# Patient Record
Sex: Female | Born: 1985 | Race: White | Hispanic: No | Marital: Married | Smoking: Never smoker
Health system: Southern US, Community
[De-identification: ages and names within clinical notes are randomized; demographics above are authoritative.]

## PROBLEM LIST (undated history)

## (undated) DIAGNOSIS — B019 Varicella without complication: Secondary | ICD-10-CM

## (undated) DIAGNOSIS — Z975 Presence of (intrauterine) contraceptive device: Secondary | ICD-10-CM

## (undated) DIAGNOSIS — K219 Gastro-esophageal reflux disease without esophagitis: Secondary | ICD-10-CM

## (undated) DIAGNOSIS — E282 Polycystic ovarian syndrome: Secondary | ICD-10-CM

## (undated) DIAGNOSIS — F418 Other specified anxiety disorders: Secondary | ICD-10-CM

## (undated) HISTORY — DX: Gastro-esophageal reflux disease without esophagitis: K21.9

## (undated) HISTORY — DX: Varicella without complication: B01.9

## (undated) HISTORY — DX: Other specified anxiety disorders: F41.8

## (undated) HISTORY — DX: Presence of (intrauterine) contraceptive device: Z97.5

## (undated) HISTORY — DX: Polycystic ovarian syndrome: E28.2

---
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
## 2009-05-23 ENCOUNTER — Emergency Department (HOSPITAL_COMMUNITY): Admission: EM | Admit: 2009-05-23 | Discharge: 2009-05-23 | Payer: Self-pay | Admitting: Family Medicine

## 2014-04-04 HISTORY — PX: MOUTH SURGERY: SHX715

## 2014-11-04 ENCOUNTER — Other Ambulatory Visit: Payer: Self-pay | Admitting: Physician Assistant

## 2014-11-04 DIAGNOSIS — C220 Liver cell carcinoma: Secondary | ICD-10-CM

## 2014-11-20 ENCOUNTER — Encounter (HOSPITAL_COMMUNITY): Payer: Self-pay | Admitting: Emergency Medicine

## 2014-11-20 ENCOUNTER — Emergency Department (HOSPITAL_COMMUNITY)
Admission: EM | Admit: 2014-11-20 | Discharge: 2014-11-20 | Disposition: A | Discharge status: Home / Self Care | Payer: BC Managed Care – PPO | Attending: Emergency Medicine | Admitting: Emergency Medicine

## 2014-11-20 ENCOUNTER — Emergency Department (HOSPITAL_COMMUNITY): Payer: BC Managed Care – PPO

## 2014-11-20 DIAGNOSIS — R55 Syncope and collapse: Secondary | ICD-10-CM | POA: Insufficient documentation

## 2014-11-20 DIAGNOSIS — R0600 Dyspnea, unspecified: Secondary | ICD-10-CM | POA: Diagnosis not present

## 2014-11-20 DIAGNOSIS — Z79899 Other long term (current) drug therapy: Secondary | ICD-10-CM | POA: Insufficient documentation

## 2014-11-20 DIAGNOSIS — Z8659 Personal history of other mental and behavioral disorders: Secondary | ICD-10-CM | POA: Diagnosis not present

## 2014-11-20 DIAGNOSIS — R42 Dizziness and giddiness: Secondary | ICD-10-CM

## 2014-11-20 DIAGNOSIS — R0602 Shortness of breath: Secondary | ICD-10-CM | POA: Diagnosis present

## 2014-11-20 LAB — I-STAT CHEM 8, ED
BUN: 16 mg/dL (ref 6–20)
CALCIUM ION: 1.18 mmol/L (ref 1.12–1.23)
CREATININE: 0.8 mg/dL (ref 0.44–1.00)
Chloride: 102 mmol/L (ref 101–111)
Glucose, Bld: 103 mg/dL — ABNORMAL HIGH (ref 65–99)
HEMATOCRIT: 40 % (ref 36.0–46.0)
HEMOGLOBIN: 13.6 g/dL (ref 12.0–15.0)
Potassium: 4.2 mmol/L (ref 3.5–5.1)
SODIUM: 138 mmol/L (ref 135–145)
TCO2: 23 mmol/L (ref 0–100)

## 2014-11-20 LAB — BASIC METABOLIC PANEL
ANION GAP: 9 (ref 5–15)
BUN: 14 mg/dL (ref 6–20)
CALCIUM: 9.3 mg/dL (ref 8.9–10.3)
CO2: 25 mmol/L (ref 22–32)
Chloride: 105 mmol/L (ref 101–111)
Creatinine, Ser: 0.78 mg/dL (ref 0.44–1.00)
Glucose, Bld: 108 mg/dL — ABNORMAL HIGH (ref 65–99)
Potassium: 3.9 mmol/L (ref 3.5–5.1)
SODIUM: 139 mmol/L (ref 135–145)

## 2014-11-20 LAB — CBC
HCT: 39.6 % (ref 36.0–46.0)
HEMOGLOBIN: 13.3 g/dL (ref 12.0–15.0)
MCH: 28.5 pg (ref 26.0–34.0)
MCHC: 33.6 g/dL (ref 30.0–36.0)
MCV: 84.8 fL (ref 78.0–100.0)
PLATELETS: 303 10*3/uL (ref 150–400)
RBC: 4.67 MIL/uL (ref 3.87–5.11)
RDW: 13.4 % (ref 11.5–15.5)
WBC: 8.7 10*3/uL (ref 4.0–10.5)

## 2014-11-20 LAB — D-DIMER, QUANTITATIVE: D-Dimer, Quant: 0.27 ug/mL-FEU (ref 0.00–0.48)

## 2014-11-20 LAB — I-STAT TROPONIN, ED: TROPONIN I, POC: 0 ng/mL (ref 0.00–0.08)

## 2014-11-20 NOTE — ED Notes (Signed)
Phlebotomy at the bedside  

## 2014-11-20 NOTE — ED Notes (Signed)
Dr. Mariane Masters at the bedside.

## 2014-11-20 NOTE — ED Notes (Signed)
Called main lab for d-dimer to add on.

## 2014-11-20 NOTE — ED Notes (Signed)
Orthostatics given to Dr. Mariane Masters, no new orders.

## 2014-11-20 NOTE — ED Provider Notes (Signed)
CSN: [id number]     Arrival date & time 11/20/14  0004 History  This chart was scribed for Varney Biles, MD by Julien Nordmann, ED Scribe. This patient was seen in room D33C/D33C and the patient's care was started at 2:11 AM.    Chief Complaint  Patient presents with  . Shortness of Breath      The history is provided by the patient. No language interpreter was used.   HPI Comments: Bridget Rich is a 29 y.o. female who presents to the Emergency Department complaining of sudden onset shortness of breath onset this evening. She states she was feeling shortness of breath while sitting on the couch and got up to use the restroom when she reports she began to feel dizzy and intermittent lightheadedness. She states she felt light headed while sitting and standing. Pt reports having some chest pain shortly after but notes she feels as if because she began to feel worried. Pt is currently on microgestin for birth control. Pt denies heart issues and lung problems, hx of blood clots in legs/lungs, long distance travel in past six weeks, recent surgery in past 6 weeks, recent episodes where she was bed ridden, chances of pregnancy, nausea and diaphoresis.  Pt is on an inosital that she was put on by her nutritionist.  Past Medical History  Diagnosis Date  . Anxiety    History reviewed. No pertinent past surgical history. No family history on file. Social History  Substance Use Topics  . Smoking status: Never Smoker   . Smokeless tobacco: None  . Alcohol Use: No   OB History    No data available     Review of Systems  A complete 10 system review of systems was obtained and all systems are negative except as noted in the HPI and PMH.    Allergies  Augmentin and Ceclor  Home Medications   Prior to Admission medications   Medication Sig Start Date End Date Taking? Authorizing Provider  norethindrone-ethinyl estradiol-iron (MICROGESTIN FE 1.5/30) 1.5-30 MG-MCG tablet Take 1 tablet by  mouth daily.   Yes Historical Provider, MD   Triage vitals: BP 126/79 mmHg  Pulse 79  Temp(Src) 98 F (36.7 C) (Oral)  Resp 24  SpO2 99%  LMP 11/18/2014 Physical Exam  Constitutional: She is oriented to person, place, and time. She appears well-developed and well-nourished. No distress.  HENT:  Head: Normocephalic and atraumatic.  Mouth/Throat: Oropharynx is clear and moist.  Eyes: EOM are normal.  Neck: Normal range of motion. No JVD present.  Cardiovascular: Normal rate, regular rhythm and normal heart sounds.   Pulmonary/Chest: Effort normal and breath sounds normal.  Lungs clear to auscultation  Abdominal: Soft. She exhibits no distension. There is no tenderness.  No palpable mass  Musculoskeletal: Normal range of motion.  Neurological: She is alert and oriented to person, place, and time.  2+ equal radial pulse bilaterally  Skin: Skin is warm and dry.  Psychiatric: She has a normal mood and affect. Judgment normal.  Nursing note and vitals reviewed.   ED Course  Procedures   DIAGNOSTIC STUDIES: Oxygen Saturation is 99% on RA, normal by my interpretation.  COORDINATION OF CARE:  2:21 AM Discussed treatment plan with pt at bedside and pt agreed to plan.  Labs Review Labs Reviewed  BASIC METABOLIC PANEL - Abnormal; Notable for the following:    Glucose, Bld 108 (*)    All other components within normal limits  I-STAT CHEM 8, ED - Abnormal;  Notable for the following:    Glucose, Bld 103 (*)    All other components within normal limits  CBC  D-DIMER, QUANTITATIVE (NOT AT [facility])  Randolm Idol, ED    Imaging Review Dg Chest 2 View  11/20/2014   CLINICAL DATA:  Shortness of breath and dizziness  EXAM: CHEST  2 VIEW  COMPARISON:  None currently available  FINDINGS: Normal heart size and mediastinal contours. No acute infiltrate or edema. No effusion or pneumothorax. Thoracic dextrocurvature without acute osseous finding.  IMPRESSION: 1. No active cardiopulmonary  disease. 2. Thoracic scoliosis   Electronically Signed   By: Monte Fantasia M.D.   On: 11/20/2014 01:25   I have personally reviewed and evaluated these images and lab results as part of my medical decision-making.   EKG Interpretation   Date/Time:  Thursday November 20 2014 00:09:19 EDT Ventricular Rate:  91 PR Interval:  178 QRS Duration: 72 QT Interval:  378 QTC Calculation: 464 R Axis:   47 Text Interpretation:  Normal sinus rhythm Normal ECG No acute changes no  old tracing Confirmed by Kathrynn Humble, MD, Thelma Comp [phone]) on 11/20/2014 2:22:09  AM      MDM   Final diagnoses:  Dizziness  Dyspnea  Near syncope    I personally performed the services described in this documentation, which was scribed in my presence. The recorded information has been reviewed and is accurate.  Pt comes in with cc of shortness of breath and dizziness - near syncope.  Near syncope x 1, dizziness is intermittent, she had 1 episode of dyspnea.  Pt is taking estrogen supplement, has hx of PCOS. WELLS score is 0 - and dimer ordered - and is neg. Will dc. Orthostatics neg as well. Advised pcp f/u.    Varney Biles, MD 11/20/14 [phone]

## 2014-11-20 NOTE — Discharge Instructions (Signed)
We saw you in the ER for the dizziness and the shortness of breath. All the results in the ER are normal, labs and imaging. We even screened you for blood clot -and the results for that are normal as well. We are not sure what is causing your symptoms. The workup in the ER is not complete, and is limited to screening for life threatening and emergent conditions only, so please see a primary care doctor for further evaluation.   Dizziness Dizziness is a common problem. It is a feeling of unsteadiness or light-headedness. You may feel like you are about to faint. Dizziness can lead to injury if you stumble or fall. A person of any age group can suffer from dizziness, but dizziness is more common in older adults. CAUSES  Dizziness can be caused by many different things, including:  Middle ear problems.  Standing for too long.  Infections.  An allergic reaction.  Aging.  An emotional response to something, such as the sight of blood.  Side effects of medicines.  Tiredness.  Problems with circulation or blood pressure.  Excessive use of alcohol or medicines, or illegal drug use.  Breathing too fast (hyperventilation).  An irregular heart rhythm (arrhythmia).  A low red blood cell count (anemia).  Pregnancy.  Vomiting, diarrhea, fever, or other illnesses that cause body fluid loss (dehydration).  Diseases or conditions such as Parkinson's disease, high blood pressure (hypertension), diabetes, and thyroid problems.  Exposure to extreme heat. DIAGNOSIS  Your health care provider will ask about your symptoms, perform a physical exam, and perform an electrocardiogram (ECG) to record the electrical activity of your heart. Your health care provider may also perform other heart or blood tests to determine the cause of your dizziness. These may include:  Transthoracic echocardiogram (TTE). During echocardiography, sound waves are used to evaluate how blood flows through your  heart.  Transesophageal echocardiogram (TEE).  Cardiac monitoring. This allows your health care provider to monitor your heart rate and rhythm in real time.  Holter monitor. This is a portable device that records your heartbeat and can help diagnose heart arrhythmias. It allows your health care provider to track your heart activity for several days if needed.  Stress tests by exercise or by giving medicine that makes the heart beat faster. TREATMENT  Treatment of dizziness depends on the cause of your symptoms and can vary greatly. HOME CARE INSTRUCTIONS   Drink enough fluids to keep your urine clear or pale yellow. This is especially important in very hot weather. In older adults, it is also important in cold weather.  Take your medicine exactly as directed if your dizziness is caused by medicines. When taking blood pressure medicines, it is especially important to get up slowly.  Rise slowly from chairs and steady yourself until you feel okay.  In the morning, first sit up on the side of the bed. When you feel okay, stand slowly while holding onto something until you know your balance is fine.  Move your legs often if you need to stand in one place for a long time. Tighten and relax your muscles in your legs while standing.  Have someone stay with you for 1-2 days if dizziness continues to be a problem. Do this until you feel you are well enough to stay alone. Have the person call your health care provider if he or she notices changes in you that are concerning.  Do not drive or use heavy machinery if you feel dizzy.  Do not drink alcohol. SEEK IMMEDIATE MEDICAL CARE IF:   Your dizziness or light-headedness gets worse.  You feel nauseous or vomit.  You have problems talking, walking, or using your arms, hands, or legs.  You feel weak.  You are not thinking clearly or you have trouble forming sentences. It may take a friend or family member to notice this.  You have chest  pain, abdominal pain, shortness of breath, or sweating.  Your vision changes.  You notice any bleeding.  You have side effects from medicine that seems to be getting worse rather than better. MAKE SURE YOU:   Understand these instructions.  Will watch your condition.  Will get help right away if you are not doing well or get worse. Document Released: 09/14/2000 Document Revised: 03/26/2013 Document Reviewed: 10/08/2010 [facility] Patient Information 2015 [place], [place]. This information is not intended to replace advice given to you by your health care provider. Make sure you discuss any questions you have with your health care provider. Near-Syncope Near-syncope (commonly known as near fainting) is sudden weakness, dizziness, or feeling like you might pass out. During an episode of near-syncope, you may also develop pale skin, have tunnel vision, or feel sick to your stomach (nauseous). Near-syncope may occur when getting up after sitting or while standing for a long time. It is caused by a sudden decrease in blood flow to the brain. This decrease can result from various causes or triggers, most of which are not serious. However, because near-syncope can sometimes be a sign of something serious, a medical evaluation is required. The specific cause is often not determined. HOME CARE INSTRUCTIONS  Monitor your condition for any changes. The following actions may help to alleviate any discomfort you are experiencing:  Have someone stay with you until you feel stable.  Lie down right away and prop your feet up if you start feeling like you might faint. Breathe deeply and steadily. Wait until all the symptoms have passed. Most of these episodes last only a few minutes. You may feel tired for several hours.   Drink enough fluids to keep your urine clear or pale yellow.   If you are taking blood pressure or heart medicine, get up slowly when seated or lying down. Take several minutes to sit and  then stand. This can reduce dizziness.  Follow up with your health care provider as directed. SEEK IMMEDIATE MEDICAL CARE IF:   You have a severe headache.   You have unusual pain in the chest, abdomen, or back.   You are bleeding from the mouth or rectum, or you have black or tarry stool.   You have an irregular or very fast heartbeat.   You have repeated fainting or have seizure-like jerking during an episode.   You faint when sitting or lying down.   You have confusion.   You have difficulty walking.   You have severe weakness.   You have vision problems.  MAKE SURE YOU:   Understand these instructions.  Will watch your condition.  Will get help right away if you are not doing well or get worse. Document Released: 03/21/2005 Document Revised: 03/26/2013 Document Reviewed: 08/24/2012 [facility] Patient Information 2015 [place], [place]. This information is not intended to replace advice given to you by your health care provider. Make sure you discuss any questions you have with your health care provider.

## 2014-11-20 NOTE — ED Notes (Signed)
C/o feeling sob and lightheaded x 45 min.  Denies chest pain.  NAD at this time.

## 2014-11-24 ENCOUNTER — Ambulatory Visit
Admission: RE | Admit: 2014-11-24 | Discharge: 2014-11-24 | Disposition: A | Discharge status: Home / Self Care | Payer: BC Managed Care – PPO | Source: Ambulatory Visit | Attending: Physician Assistant | Admitting: Physician Assistant

## 2014-11-24 ENCOUNTER — Other Ambulatory Visit: Payer: Self-pay | Admitting: Physician Assistant

## 2014-11-24 DIAGNOSIS — R1013 Epigastric pain: Secondary | ICD-10-CM

## 2014-11-24 DIAGNOSIS — C220 Liver cell carcinoma: Secondary | ICD-10-CM

## 2016-01-18 LAB — HM PAP SMEAR: HM Pap smear: NEGATIVE

## 2016-01-21 ENCOUNTER — Ambulatory Visit (INDEPENDENT_AMBULATORY_CARE_PROVIDER_SITE_OTHER): Payer: BC Managed Care – PPO | Admitting: Family Medicine

## 2016-01-21 ENCOUNTER — Encounter: Payer: Self-pay | Admitting: Family Medicine

## 2016-01-21 DIAGNOSIS — E282 Polycystic ovarian syndrome: Secondary | ICD-10-CM | POA: Diagnosis not present

## 2016-01-21 NOTE — Progress Notes (Signed)
Medical Nutrition Therapy:  Appt start time: 1430 end time:  [id number].  Assessment:  Primary concerns today: Weight management and best diet to prevent diabetes.  Arlicia has seen another RD, who helped her tremendously with her PCOS.  She would like more guidance and structure with respect to her food choices than provided by her RD, however.  Adryana said she is fine with her body image, but that she knows she does not eat healthfully, and would like to change this.  She is aware of her risk for diabetes, and seems very motivated to prevent this.  She tried a course of metformin, but did not tolerate it well.  Last A1C was 5.4.    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  Her lunch time M-F is consistent.  She shares a home with her boyfriend, with whom she eats dinner, which is usually takeout or restaurant meals.  Kamarah does not like to shop or to  cook.    Learning Readiness: Change in progress  Usual eating pattern includes 3 meals and 0-1 snack per day. Frequent foods and beverages include water. Half-sweet tea; tuna, apple, and string cheese for lunch; pizza/Mexican/store chx pot pie/spaghetti/steak.  Avoided foods include olives, hotdogs.   Djuana said she grew up eating a lot of fast food and processed foods.   Usual physical activity includes inconsistent kick boxing, ~30 min walking the dog most days of the week.  24-hr recall: (Up at 6:40 AM) B (7:30 AM)-   [place] spicy fried chx&honey biscuit, potato cake, 20 oz half-swt tea Snk ( AM)-   Water L (12 PM)-  3 oz tuna, 1 str cheese, apple, water Snk ( PM)-  water D (6:30 PM)-  1/2 c lentil noodles, 3 c meat sauce, water Snk ( PM)-  --- Typical day? Yes.  On weekends lunch varies, usually restaurant or takeout or skipped following a brunch.    Progress Towards Goal(s):  In progress.   Nutritional Diagnosis:  [place]-3.3 Overweight/obesity As related to energy imbalance.  As evidenced by BMI >43.    Intervention:   Nutrition education.  Handouts given during visit include:  AVS  Demonstrated degree of understanding via:  Teach Back  Barriers to learning/adherence to lifestyle change: Longstanding habits; disinterest in food preparation and reliance on restaurant foods.    Monitoring/Evaluation:  Dietary intake, exercise, and body weight in 6 week(s).

## 2016-01-21 NOTE — Patient Instructions (Addendum)
-   TASTE PREFERENCES ARE LEARNED.  This means that it will get easier to choose foods you know are good for you if you are exposed to them enough.  Preference tends to follow practice.    - Eat at least 3 REAL meals (and 1-2 snacks per day).  Aim for no more than 5 hours between eating.  Eat breakfast within one hour of getting up.    - A REAL meal includes a source of protein, some starch, and veg's and/or fruit.    - OR: Would you serve this to a guest in your home, and call it a meal.   - If your current lunch doesn't allow you to get to dinner without feeling very hungry, include a snack in the afternoon, such as fruit, yogurt, <1/4 c nuts/seeds.     - Track the # of times your dinner conforms to a real meal as defined above.    - Advance planning will be key to making good food choices.    - Make a list of 7-10 dinner meals that taste good, are relatively quick and easy to prepare, and that meet your nutritional needs.  Use this as a basis for shopping, so you can make one of these meals any time.  Bring your list to your follow-up appointment for review.    - With your PCOS, you want to eat in a low-glycemic way.  Be careful about fruits like bananas, tropical fruits like pineapple, mango, papaya, grapes, oranges, watermelon.  Also limit refined carb's including bread and bakery products.  When you choose a carb food, go for ones with the most fiber.  Note: Milk and yogurt have carb's, but these will not spike your blood sugar the way starchy foods/sugars will.    - Generally, whole, real foods are better for blood sugars (vs highly processed).    - Qs? Email [email or web address].

## 2016-03-03 ENCOUNTER — Ambulatory Visit (INDEPENDENT_AMBULATORY_CARE_PROVIDER_SITE_OTHER): Payer: BC Managed Care – PPO | Admitting: Family Medicine

## 2016-03-03 ENCOUNTER — Encounter: Payer: Self-pay | Admitting: Family Medicine

## 2016-03-03 DIAGNOSIS — E282 Polycystic ovarian syndrome: Secondary | ICD-10-CM | POA: Diagnosis not present

## 2016-03-03 NOTE — Patient Instructions (Addendum)
-   Make a list of meal alternatives for times when you are in a hurry.  Include foods from home you tend to keep on hand as well as grocery store and restaurant options.    - Use this list as a basis for shopping, as you willl your dinner list.  Bring to follow-up appt for review.   - Have a conversation with Bridget Rich about how he can best support you in your efforts to eat more healthfully. - Make a list of 7-10 dinner meals that taste good, are relatively quick and easy to prepare, and that meet your nutritional needs.  Use this as a basis for shopping, so you can make one of these meals any time.  Bring your list to your follow-up appointment for review.    Goals:  1. Physical activity goal:  At least 45 minutes 3 X wk.  Record # of minutes on your calendar.  Include dog walking.   2. Eat at least 3 REAL meals (and 1-2 snacks per day).  Aim for no more than 5 hours between eating.  Eat breakfast within one hour of getting up.   - Track the # of times your dinner conforms to a real meal as defined above.   3. If you make a food or exercise choice that is not in your best long-term interest, analyze what led that choice, and what you can put in place to have a different outcome next time.    Recommended reading:  Elzie Rings Rubin's The Four Tendencies.

## 2016-03-03 NOTE — Progress Notes (Signed)
Medical Nutrition Therapy:  Appt start time: 1430 end time:  [id number]. Therapist Madalyn Rob, LPCA  Assessment:  Primary concerns today: Weight management and best diet to prevent diabetes.  Bridget Rich said she has been inconsistent in her eating behaviors.  This week, she did a lot of advance preparation for meals, which has made everything easier for her, however, and she feels like this may be a good start that she can maintain going forward.    We talked about what led to yesterday's dinner of pizza, which was mainly time crunch, and looked at options she can see in retrospect that would have been better choices.  There were surprisingly many, actually.  Also discussed talking to her boyfriend about his supporting her efforts at better eating by not bringing home pizza, Coke, and cinnamon twists for dinner.    Tylor forgot to make a list of meals, but said she will do so before her next appt.    Aven is walking her dog at least 5 X wk, but would like to incorporate additional exercise.  We established a specific goal for this, and she wants to continue to work on the 3 real meals a day goal.    24-hr recall:  (Up at 6 AM; water & 12 oz 2% [employer] milk) B (7:30 AM)-  1 fried chx biscuit, 1 tbsp honey, 1 svng hash browns, water Snk ( AM)-  water L (11 PM)-  4 oz chx, mixed veg's, water Snk (1:30 PM)-1 Kind Breakfast Bar, water   D (6:30 PM)-  4 slc pepperoni pizza, 4 cinnamon twists, 16 oz Coke Snk ( PM)-  water Typical day? Yes.  Although there are other days when all meals are made at home.    Progress Towards Goal(s):  In progress.   Nutritional Diagnosis:  [place]-3.3 Overweight/obesity As related to energy imbalance.  As evidenced by BMI >43.    Intervention:  Nutrition education.  Handouts given during visit include:  AVS  Demonstrated degree of understanding via:  Teach Back  Barriers to learning/adherence to lifestyle change: Longstanding habits; disinterest in food preparation  and reliance on restaurant foods.    Monitoring/Evaluation:  Dietary intake, exercise, and body weight in 8 week(s).  No appts available sooner.

## 2016-05-03 ENCOUNTER — Ambulatory Visit: Payer: BC Managed Care – PPO | Admitting: Family Medicine

## 2016-05-26 ENCOUNTER — Ambulatory Visit: Payer: BC Managed Care – PPO | Admitting: Family Medicine

## 2016-12-03 HISTORY — PX: BARIATRIC SURGERY: SHX1103

## 2017-01-25 LAB — HEPATIC FUNCTION PANEL
ALT: 24 (ref 7–35)
AST: 23 (ref 13–35)
Bilirubin, Total: 0.4

## 2017-01-25 LAB — BASIC METABOLIC PANEL
Creatinine: 0.6 (ref <=1.1)
Glucose: 94
Potassium: 4.1 (ref 3.4–5.3)
Sodium: 136 — AB (ref 137–147)

## 2017-12-29 ENCOUNTER — Encounter: Payer: Self-pay | Admitting: Sports Medicine

## 2017-12-29 ENCOUNTER — Ambulatory Visit: Payer: Commercial Managed Care - PPO | Admitting: Sports Medicine

## 2017-12-29 DIAGNOSIS — M9903 Segmental and somatic dysfunction of lumbar region: Secondary | ICD-10-CM

## 2017-12-29 DIAGNOSIS — M9902 Segmental and somatic dysfunction of thoracic region: Secondary | ICD-10-CM | POA: Diagnosis not present

## 2017-12-29 DIAGNOSIS — M9908 Segmental and somatic dysfunction of rib cage: Secondary | ICD-10-CM

## 2017-12-29 DIAGNOSIS — M9901 Segmental and somatic dysfunction of cervical region: Secondary | ICD-10-CM | POA: Diagnosis not present

## 2017-12-29 DIAGNOSIS — M545 Low back pain, unspecified: Secondary | ICD-10-CM

## 2017-12-29 DIAGNOSIS — M9905 Segmental and somatic dysfunction of pelvic region: Secondary | ICD-10-CM

## 2017-12-29 NOTE — Progress Notes (Signed)

## 2017-12-29 NOTE — Patient Instructions (Addendum)
Please perform the exercise program that we have prepared for you and gone over in detail on a daily basis.  In addition to the handout you were provided you can access your program through: [email or web address]   Your unique program code is: Rodrigo Ran    Also check out [employer]" which is a program developed by Dr. Minerva Ends.   There are links to a couple of his YouTube Videos below and I would like to see performing one of his videos 5-6 days per week.    A good intro video is: "Independence from Pain 7-minute Video" - [email or web address]   Exercises that focus more on the neck are as below: Dr. Archie Balboa with [place] teaching neck and shoulder details Part 1 - https://[email or web address] Part 2 Dr. Archie Balboa with [facility] quick routine to practice daily - [email or web address]  Do not try to attempt the entire video when first beginning.    Try breaking of each exercise that he goes into shorter segments.  Otherwise if they perform an exercise for 45 seconds, start with 15 seconds and rest and then resume when they begin the new activity.  If you work your way up to being able to do these videos without having to stop, I expect you will see significant improvements in your pain.  If you enjoy his videos and would like to find out more you can look on his website: [email or web address]  He has a workout streaming option as well as a DVD set available for purchase.  Amazon has the best price for his DVDs.

## 2017-12-29 NOTE — Progress Notes (Signed)
PROCEDURE NOTE : OSTEOPATHIC MANIPULATION The decision today to treat with Osteopathic Manipulative Therapy (OMT) was based on physical exam findings. Verbal consent was obtained following a discussion with the patient regarding the of risks, benefits and potential side effects, including an acute pain flare,post manipulation soreness and need for repeat treatments. Additionally, we specifically discussed the minimal risk of  injury to neurovascular structures associated with Cervical manipulation.   Contraindications to OMT: NONE  Manipulation was performed as below: Regions Treated OMT Techniques Used  Cervical spine Thoracic spine Ribs Lumbar spine Pelvis HVLA muscle energy myofascial release articulatory facilitated positional release   The patient tolerated the treatment well and reported Improved symptoms following treatment today. Patient was given medications, exercises, stretches and lifestyle modifications per AVS and verbally.   OSTEOPATHIC/STRUCTURAL EXAM:   OA - rotated right C5 ERS left (Extended, Rotated & Sidebent) T2 -4 Neutral, Rotated LEFT, Sidebent RIGHT T8 -10 Neutral, Rotated RIGHT, Sidebent LEFT Rib 6 Right  Posterior L4 FRS right (Flexed, Rotated & Sidebent) Left psoas spasm Left anterior innonimate

## 2017-12-29 NOTE — Progress Notes (Signed)
Juanda Bond. Keyandre Pileggi, [place] at [place] - 32 y.o. female MRN [id number]  Date of birth: 1985/11/03  Visit Date: 12/29/2017  PCP: Marylynn Pearson, MD   Referred by: Marylynn Pearson, MD   Scribe(s) for today's visit: Josepha Pigg, CMA  SUBJECTIVE:  Bridget Rich is here for Initial Assessment (back pain)   HPI: Her back pain symptoms INITIALLY: Began several years ago and MOI is known. She denies past injury to the neck or back. She is R hand dominant.  Described as mild stiffness, radiating to both arms, L>R. She reports occasional n/t in her fingers, L hand.  Worsened with squatting, extending legs in seating position.  Additional associated symptoms include: She has noticed popping in the L hip when sitting with legs extended and then pulling them towards her. She reports having long term issues with tenseness in the upper back, L>R. She had bariatric surgery about 1 year ago, she has lost 90 lbs. She reports popping in the knee. She denies swelling in legs, ankles, feet. 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 She denies weakness in L arm and leg.     At this time symptoms are worsening compared to onset. She has been seen by chiro in the past, had XR but no adjustment was done. She has taken Tylenol prn for pain with some relief, mostly for HA.   REVIEW OF SYSTEMS: Denies night time disturbances. Denies fevers, chills, or night sweats. Denies unexplained weight loss (imtentional weight loss s/p bariatric surgery). Denies personal history of cancer. Denies changes in bowel or bladder habits. Denies recent unreported falls. Denies new or worsening dyspnea or wheezing. Denies headaches or dizziness.  Reports numbness, tingling fingers   Denies dizziness or presyncopal episodes Denies lower extremity edema    HISTORY:   Prior history reviewed and updated per electronic medical record.  Social History   Occupational History  . Not on file  Tobacco Use  . Smoking status: Never Smoker  . Smokeless tobacco: Never Used  Substance and Sexual Activity  . Alcohol use: No  . Drug use: No  . Sexual activity: Not on file   Social History   Social History Narrative  . Not on file    Past Medical History:  Diagnosis Date  . Anxiety   . PCOS (polycystic ovarian syndrome)    Past Surgical History:  Procedure Laterality Date  . BARIATRIC SURGERY  12/2016  . MOUTH SURGERY     family history includes Anxiety disorder in her sister and sister; Bipolar disorder in her cousin; COPD in her paternal grandfather; Depression in her father, maternal grandfather, and maternal grandmother; Diabetes in her father.  DATA OBTAINED & REVIEWED:  No results for input(s): HGBA1C, LABURIC, CREATINE in the last 8760 hours. No problems updated.   OBJECTIVE:  VS:  HT:5' 7.5" (171.5 cm)   WT:218 lb 12.8 oz (99.2 kg)  BMI:33.74    BP:110/78  HR:60bpm  TEMP: ( )  RESP:97 %   PHYSICAL EXAM: CONSTITUTIONAL: Well-developed, Well-nourished and In no acute distress PSYCHIATRIC: Alert & appropriately interactive. and Not depressed or anxious appearing. RESPIRATORY: No increased work of breathing and Trachea Midline EYES: Pupils are equal., EOM intact without nystagmus. and No scleral icterus.  VASCULAR EXAM: Warm and well perfused NEURO: unremarkable Normal associated myotomal distribution strength to manual muscle  testing Normal sensation to light touch Normal and symmetric associated DTRs  MSK Exam: BACK Exam: Normal alignment & Contours Skin: No overlying erythema/ecchymosis  MOTOR TESTING: Intact in all LE myotomes and Able to heel and toe walk without difficutly        RIGHT    LEFT Straight leg raise-------------------------: normal, no pain                         normal, no pain Braggard Stretch  Test------------------: normal, no pain                         normal, no pain Slump Sign--------------------------------: normal, no pain                         normal, no pain  REFLEXES Right Left  DTR - L3/4 -Patellar 2+ 2+  DTR - L5/S1 - Achilles 2+ 2+    ASSESSMENT  No diagnosis found.  PLAN:  Pertinent additional documentation may be included in corresponding procedure notes, imaging studies, problem based documentation and patient instructions.  Procedures:  . Osteopathic manipulation was performed today based on physical exam findings.  Please see procedure note for further information including Osteopathic Exam findings . Discussed the foundation of treatment for this condition is physical therapy and/or daily (5-6 days/week) therapeutic exercises, focusing on core strengthening, coordination, neuromuscular control/reeducation.  Therapeutic exercises prescribed per procedure note.  Medications:  No orders of the defined types were placed in this encounter.  Discussion/Instructions: No problem-specific Assessment & Plan notes found for this encounter.  . Functional low back pain.  Should respond well to conservative measures. . Links to [employer] provided today per Patient Instructions.  These exercises were developed by Minerva Ends, DC with a strong emphasis on core neuromuscular reducation and postural realignment through body-weight exercises. . Discussed red flag symptoms that warrant earlier emergent evaluation and patient voices understanding. . Activity modifications and the importance of avoiding exacerbating activities (limiting pain to no more than a 4 / 10 during or following activity) recommended and discussed.  Follow-up:  . Return in about 3 weeks (around 01/19/2018).  . If any lack of improvement consider: further diagnostic evaluation with X-rays and referral to Physical Therapy . At follow up will plan to consider: repeat osteopathic  manipulation     CMA/ATC served as scribe during this visit. History, Physical, and Plan performed by medical provider. Documentation and orders reviewed and attested to.      Gerda Diss, [facility] Physician

## 2018-01-22 ENCOUNTER — Encounter: Payer: Self-pay | Admitting: Sports Medicine

## 2018-01-22 ENCOUNTER — Ambulatory Visit: Payer: Commercial Managed Care - PPO | Admitting: Sports Medicine

## 2018-01-22 VITALS — BP 108/72 | HR 85 | Ht 67.5 in | Wt 218.2 lb

## 2018-01-22 DIAGNOSIS — M9904 Segmental and somatic dysfunction of sacral region: Secondary | ICD-10-CM

## 2018-01-22 DIAGNOSIS — G8929 Other chronic pain: Secondary | ICD-10-CM

## 2018-01-22 DIAGNOSIS — M545 Low back pain, unspecified: Secondary | ICD-10-CM

## 2018-01-22 DIAGNOSIS — M9906 Segmental and somatic dysfunction of lower extremity: Secondary | ICD-10-CM

## 2018-01-22 DIAGNOSIS — M9905 Segmental and somatic dysfunction of pelvic region: Secondary | ICD-10-CM

## 2018-01-22 NOTE — Progress Notes (Signed)
PROCEDURE NOTE : OSTEOPATHIC MANIPULATION The decision today to treat with Osteopathic Manipulative Therapy (OMT) was based on physical exam findings. Verbal consent was obtained following a discussion with the patient regarding the of risks, benefits and potential side effects, including an acute pain flare,post manipulation soreness and need for repeat treatments.     Contraindications to OMT: NONE  Manipulation was performed as below: Regions Treated OMT Techniques Used  Lumbar spine Pelvis Lower extremities HVLA muscle energy myofascial release articulatory soft tissue facilitated positional release   The patient tolerated the treatment well and reported Improved symptoms following treatment today. Patient was given medications, exercises, stretches and lifestyle modifications per AVS and verbally.   OSTEOPATHIC/STRUCTURAL EXAM:   L3 FRS left (Flexed, Rotated & Sidebent) Left psoas spasm Left anterior innonimate left externally rotated hip with hip flexor spasm

## 2018-01-22 NOTE — Progress Notes (Signed)
Juanda Bond. Ayahna Solazzo, [place] at [place] - 32 y.o. female MRN [id number]  Date of birth: [birth year]  Visit Date: 01/22/2018  PCP: Marylynn Pearson, MD   Referred by: Marylynn Pearson, MD   Scribe(s) for today's visit: Wendy Poet, LAT, ATC  SUBJECTIVE:  Bridget Rich is here for Follow-up (neck and back pain)   HPI: Her back pain symptoms INITIALLY: Began several years ago and MOI is known. She denies past injury to the neck or back. She is R hand dominant.  Described as mild stiffness, radiating to both arms, L>R. She reports occasional n/t in her fingers, L hand.  Worsened with squatting, extending legs in seating position.  Additional associated symptoms include: She has noticed popping in the L hip when sitting with legs extended and then pulling them towards her. She reports having long term issues with tenseness in the upper back, L>R. She had bariatric surgery about 1 year ago, she has lost 90 lbs. She reports popping in the knee. She denies swelling in legs, ankles, feet. 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 She denies weakness in L arm and leg.     At this time symptoms are worsening compared to onset. She has been seen by chiro in the past, had XR but no adjustment was done. She has taken Tylenol prn for pain with some relief, mostly for HA.   01/22/2018: Compared to the last office visit on 12/29/17, her previously described neck and back symptoms are improving.  She states that her lower back continues to be tense especially when she is travelling more in her car for work. Current symptoms are mild & are radiating to L UE.  She also notes a lot of popping in her L elbow. She has been doing her HEP consisting of pelvic tilts, knee rocks, cat/camel and t-spine rotation.  She has been doing her [employer].  REVIEW OF SYSTEMS: Denies night time disturbances. Denies fevers, chills, or night sweats. Denies unexplained weight loss (imtentional weight loss s/p bariatric surgery). Denies personal history of cancer. Denies changes in bowel or bladder habits. Denies recent unreported falls. Denies new or worsening dyspnea or wheezing. Denies headaches or dizziness.  Reports numbness, tingling fingers (left hand) Denies dizziness or presyncopal episodes Denies lower extremity edema    HISTORY:  Prior history reviewed and updated per electronic medical record.  Social History   Occupational History  . Not on file  Tobacco Use  . Smoking status: Never Smoker  . Smokeless tobacco: Never Used  Substance and Sexual Activity  . Alcohol use: No  . Drug use: No  . Sexual activity: Not on file   Social History   Social History Narrative  . Not on file     DATA OBTAINED & REVIEWED:  No results for input(s): HGBA1C, LABURIC, CREATINE in the last 8760 hours. .   OBJECTIVE:  VS:  HT:5' 7.5" (171.5 cm)   WT:218 lb 3.2 oz (99 kg)  BMI:33.65    BP:108/72  HR:85bpm  TEMP: ( )  RESP:96 %   PHYSICAL EXAM: CONSTITUTIONAL: Well-developed, Well-nourished and In no acute distress PSYCHIATRIC: Alert & appropriately interactive. and Not depressed or anxious appearing. RESPIRATORY: No increased work of breathing and Trachea Midline EYES: Pupils are equal., EOM intact without nystagmus. and No scleral icterus.  VASCULAR  EXAM: Warm and well perfused NEURO: unremarkable  MSK Exam: Right hip and back exam  Well aligned, no significant deformity. No overlying skin changes. TTP over Right paraspinal musculature as well as tensor fascia lata and quadratus lumborum.   RANGE OF MOTION & STRENGTH  Normal, non-painful Straight leg raise, internal and external rotation of the hip in the seated position but markedly limited IR and a hip extended position, functional psoas contracture    SPECIALITY TESTING:  Normal gait.      ASSESSMENT   1. Chronic left-sided low back pain without sciatica   2. Somatic dysfunction of lower extremity   3. Somatic dysfunction of pelvis region   4. Somatic dysfunction of sacral region     PLAN:  Pertinent additional documentation may be included in corresponding procedure notes, imaging studies, problem based documentation and patient instructions.  Procedures:  . Osteopathic manipulation was performed today based on physical exam findings.  Please see procedure note for further information including Osteopathic Exam findings  Medications:  No orders of the defined types were placed in this encounter.  Discussion/Instructions: No problem-specific Assessment & Plan notes found for this encounter.  . She is doing significantly better.  Right hip flexor/quadratus lumborum adhesions addressed today. . Continue previously prescribed home exercise program.  . Discussed red flag symptoms that warrant earlier emergent evaluation and patient voices understanding. . Activity modifications and the importance of avoiding exacerbating activities (limiting pain to no more than a 4 / 10 during or following activity) recommended and discussed.  Follow-up:  . Return if symptoms worsen or fail to improve.   . If any lack of improvement consider: further diagnostic evaluation with Plain film x-rays  . At follow up will plan to consider: repeat osteopathic manipulation     CMA/ATC served as scribe during this visit. History, Physical, and Plan performed by medical provider. Documentation and orders reviewed and attested to.      Gerda Diss, [facility] Physician

## 2018-01-28 DIAGNOSIS — F458 Other somatoform disorders: Principal | ICD-10-CM

## 2018-01-28 DIAGNOSIS — F419 Anxiety disorder, unspecified: Secondary | ICD-10-CM

## 2018-02-09 ENCOUNTER — Ambulatory Visit: Payer: Commercial Managed Care - PPO | Admitting: Psychiatry

## 2018-02-09 ENCOUNTER — Encounter: Payer: Self-pay | Admitting: Psychiatry

## 2018-02-09 VITALS — BP 102/64 | HR 73

## 2018-02-09 DIAGNOSIS — F5105 Insomnia due to other mental disorder: Secondary | ICD-10-CM

## 2018-02-09 DIAGNOSIS — F419 Anxiety disorder, unspecified: Secondary | ICD-10-CM | POA: Diagnosis not present

## 2018-02-09 MED ORDER — OXCARBAZEPINE 300 MG PO TABS: 300.0000 mg | ORAL_TABLET | Freq: Every day | ORAL | 1 refills | Status: DC

## 2018-02-09 MED ORDER — CLONAZEPAM 0.5 MG PO TABS: ORAL_TABLET | ORAL | 5 refills | Status: DC

## 2018-02-09 NOTE — Progress Notes (Signed)
Bridget Rich [id number] Feb 06, 1986 32 y.o.  Subjective:   Patient ID:  Bridget Rich is a 32 y.o. (DOB [birth year]) female.  Chief Complaint:  Chief Complaint  Patient presents with  . Follow-up    h/o anxiety    HPI Bridget Rich presents to the office today for follow-up of mood, anxiety, and insomnia. She reports that her mood has been stable other than occasional changes in mood in response to processing things in therapy. Denies any recent anxiety. Sleeping well. Typically takes Klonopin 0.5 mg 1-2 po QHS. She reports that she does not sleep as well when she does not take Klonopin. She reports that her appetite has been stable. She reports that her energy and motivation are "great." Denies concentration impairment. Denies SI.   She reports that she enjoys her new job and has a better work life balance.    Review of Systems:  Review of Systems  Gastrointestinal: Negative for constipation and diarrhea.  Musculoskeletal: Negative for gait problem.       Reports recent tightness in shoulder and reports that this was improved with some stretches and exercises.   Neurological: Negative for tremors.  Psychiatric/Behavioral:       Please refer to HPI    Medications: I have reviewed the patient's current medications.  Current Outpatient Medications  Medication Sig Dispense Refill  . Biotin w/ Vitamins C & E (HAIR/SKIN/NAILS PO) Take by mouth.    . Calcium Carbonate (CALCIUM 600 PO) Take by mouth 2 (two) times daily.    . Cholecalciferol (VITAMIN D) 2000 units CAPS Take by mouth 2 (two) times daily.    . cyanocobalamin 1000 MCG tablet Take 1,000 mcg by mouth daily.    Blanchie Dessert Chiro-Inositol (OVASITOL) 2000-50 MG PACK Take by mouth. Taking for PCOS.    . Omega-3 Fatty Acids (FISH OIL) 1360 MG CAPS Take by mouth.    Marland Kitchen omeprazole (PRILOSEC) 20 MG capsule Take 20 mg by mouth daily.    . Oxcarbazepine (TRILEPTAL) 300 MG tablet Take 1 tablet (300 mg total) by mouth at  bedtime. 90 tablet 1  . Prenatal Vit-Fe Fumarate-FA (PRENATAL PO) Take by mouth.    . Probiotic Product (PROBIOTIC DAILY PO) Take by mouth.    . Pyridoxine HCl (VITAMIN B6) 200 MG TABS Take by mouth.    . vitamin C (ASCORBIC ACID) 500 MG tablet Take 500 mg by mouth daily.    Derrill Memo ON 02/12/2019] clonazePAM (KLONOPIN) 0.5 MG tablet Take 1-2 tabs po QHS prn 60 tablet 5   No current facility-administered medications for this visit.     Medication Side Effects: None  Allergies:  Allergies  Allergen Reactions  . Augmentin [Amoxicillin-Pot Clavulanate] Other (See Comments)    unknown  . Ceclor [Cefaclor] Other (See Comments)    unknown  . Other Other (See Comments)    IBU - s/p bariatric surgery    Past Medical History:  Diagnosis Date  . Anxiety   . PCOS (polycystic ovarian syndrome)     Family History  Problem Relation Age of Onset  . Diabetes Father   . Depression Father   . Anxiety disorder Sister   . Depression Maternal Grandfather   . Depression Maternal Grandmother   . COPD Paternal Grandfather   . Bipolar disorder Cousin   . Anxiety disorder Sister     Social History   Socioeconomic History  . Marital status: Single    Spouse name: Not on file  . Number of  children: Not on file  . Years of education: Not on file  . Highest education level: Not on file  Occupational History  . Not on file  Social Needs  . Financial resource strain: Not on file  . Food insecurity:    Worry: Not on file    Inability: Not on file  . Transportation needs:    Medical: Not on file    Non-medical: Not on file  Tobacco Use  . Smoking status: Never Smoker  . Smokeless tobacco: Never Used  Substance and Sexual Activity  . Alcohol use: No  . Drug use: No  . Sexual activity: Not on file  Lifestyle  . Physical activity:    Days per week: Not on file    Minutes per session: Not on file  . Stress: Not on file  Relationships  . Social connections:    Talks on phone: Not on  file    Gets together: Not on file    Attends religious service: Not on file    Active member of club or organization: Not on file    Attends meetings of clubs or organizations: Not on file    Relationship status: Not on file  . Intimate partner violence:    Fear of current or ex partner: Not on file    Emotionally abused: Not on file    Physically abused: Not on file    Forced sexual activity: Not on file  Other Topics Concern  . Not on file  Social History Narrative  . Not on file    Past Medical History, Surgical history, Social history, and Family history were reviewed and updated as appropriate.   Please see review of systems for further details on the patient's review from today.   Objective:   Physical Exam:  BP 102/64   Pulse 73   Physical Exam  Constitutional: She is oriented to person, place, and time. She appears well-developed. No distress.  Musculoskeletal: She exhibits no deformity.  Neurological: She is alert and oriented to person, place, and time. Coordination normal.  Psychiatric: She has a normal mood and affect. Her speech is normal and behavior is normal. Judgment and thought content normal. Her mood appears not anxious. Her affect is not angry, not blunt, not labile and not inappropriate. Cognition and memory are normal. She does not exhibit a depressed mood. She expresses no homicidal and no suicidal ideation. She expresses no suicidal plans and no homicidal plans.  Insight intact. No auditory or visual hallucinations. No delusions.     Lab Review:     Component Value Date/Time   NA 138 11/20/2014 0243   K 4.2 11/20/2014 0243   CL 102 11/20/2014 0243   CO2 25 11/20/2014 0041   GLUCOSE 103 (H) 11/20/2014 0243   BUN 16 11/20/2014 0243   CREATININE 0.80 11/20/2014 0243   CALCIUM 9.3 11/20/2014 0041   GFRNONAA >60 11/20/2014 0041   GFRAA >60 11/20/2014 0041       Component Value Date/Time   WBC 8.7 11/20/2014 0041   RBC 4.67 11/20/2014 0041    HGB 13.6 11/20/2014 0243   HCT 40.0 11/20/2014 0243   PLT 303 11/20/2014 0041   MCV 84.8 11/20/2014 0041   MCH 28.5 11/20/2014 0041   MCHC 33.6 11/20/2014 0041   RDW 13.4 11/20/2014 0041    No results found for: POCLITH, LITHIUM   No results found for: PHENYTOIN, PHENOBARB, VALPROATE, CBMZ   .res Assessment: Plan:    Anxiety disorder,  unspecified type - Plan: Oxcarbazepine (TRILEPTAL) 300 MG tablet, clonazePAM (KLONOPIN) 0.5 MG tablet  Insomnia secondary to anxiety - Plan: Oxcarbazepine (TRILEPTAL) 300 MG tablet, clonazePAM (KLONOPIN) 0.5 MG tablet  Please see After Visit Summary for patient specific instructions.  Future Appointments  Date Time Provider [place]  08/10/2018  8:30 AM Thayer Headings, PMHNP [facility] None    No orders of the defined types were placed in this encounter.     -------------------------------

## 2018-02-11 ENCOUNTER — Encounter: Payer: Self-pay | Admitting: Sports Medicine

## 2018-03-15 ENCOUNTER — Other Ambulatory Visit: Payer: Self-pay | Admitting: Psychiatry

## 2018-03-15 DIAGNOSIS — F419 Anxiety disorder, unspecified: Secondary | ICD-10-CM

## 2018-03-15 DIAGNOSIS — F5105 Insomnia due to other mental disorder: Secondary | ICD-10-CM

## 2018-04-20 ENCOUNTER — Encounter: Payer: Self-pay | Admitting: Family Medicine

## 2018-04-20 ENCOUNTER — Telehealth: Payer: Commercial Managed Care - PPO | Admitting: Family Medicine

## 2018-04-20 DIAGNOSIS — J060 Acute laryngopharyngitis: Secondary | ICD-10-CM

## 2018-04-20 DIAGNOSIS — R0981 Nasal congestion: Secondary | ICD-10-CM | POA: Diagnosis not present

## 2018-04-20 MED ORDER — AZELASTINE HCL 0.1 % NA SOLN: 1.0000 | Freq: Two times a day (BID) | NASAL | 12 refills | Status: DC

## 2018-04-20 NOTE — Progress Notes (Signed)
[place], sinusitis that is bacterial is diagnosed per the IDSA guideline at 7 days with clinical exam indication and 7-10 days based on timeline- if you feel you may be experiencing a sinus infection due to previous experience you may need confirmation with a face to face visit to confirm as the potential conditions is extensive based on your responses. The evisit the protocol supports treating this condition based on your responses as viral. I hope you understand. Please see the info below for your face to face suggestions. Thanks.  Based on what you shared with me it looks like you have a condition that should be evaluated in a face to face office visit.  NOTE: If you entered your credit card information for this eVisit, you will not be charged. You may see a "hold" on your card for the $30 but that hold will drop off and you will not have a charge processed.  If you are having a true medical emergency please call 911.  If you need an urgent face to face visit, [facility] has four urgent care centers for your convenience.  If you need care fast and have a high deductible or no insurance consider:   [email or web address] to reserve your spot online an avoid wait times  [facility] [address] [place], [place] [address] 8 am to 8 pm Monday-Friday 10 am to 4 pm Saturday-Sunday *Across the street from [employer]  [place], [address] 8 am to 5 pm Monday-Friday * In the [facility] on the [facility]   The following sites will take your  insurance:  . [facility] [place] a Provider at this Location  [address] [place], [place] [address] . 10 am to 8 pm Monday-Friday . 12 pm to 8 pm Saturday-Sunday   . [facility] at [place] a Provider at this Location  [address],  [place], [place] [address] . 8 am to 8 pm Monday-Friday . 9 am to 6 pm Saturday . 11 am to 6 pm Sunday   . [facility] at [place] Get Driving Directions  [address], [place] [address] . 8 am to 8 pm Monday-Friday . 8 am to 4 pm Saturday-Sunday   Your e-visit answers were reviewed by a board certified advanced clinical practitioner to complete your personal care plan.  Thank you for using e-Visits.

## 2018-04-20 NOTE — Progress Notes (Signed)
We are sorry that you are not feeling well.  Here is how we plan to help!  Your symptoms indicate a likely viral infection (Pharyngitis).   Pharyngitis is inflammation in the back of the throat which can cause a sore throat, scratchiness and sometimes difficulty swallowing.   Pharyngitis is typically caused by a respiratory virus and will just run its course.  Please keep in mind that your symptoms could last up to 10 days.  For throat pain, we recommend over the counter oral pain relief medications such as acetaminophen or aspirin, or anti-inflammatory medications such as ibuprofen or naproxen sodium.  Topical treatments such as oral throat lozenges or sprays may be used as needed.  Avoid close contact with loved ones, especially the very young and elderly.  Remember to wash your hands thoroughly throughout the day as this is the number one way to prevent the spread of infection and wipe down door knobs and counters with disinfectant.  I did prescribe a nasal spray to assist with the runny nose that may worsen sore throat symptoms. For loss of voice warm liquids and vocal rest are often helpful.  After careful review of your answers, I would not recommend and antibiotic for your condition.  Antibiotics should not be used to treat conditions that we suspect are caused by viruses like the virus that causes the common cold or flu. However, some people can have Strep with atypical symptoms. You may need formal testing in clinic or office to confirm if your symptoms continue or worsen. This would require a face to face visit for evaluation and definitive diagnosis.  Providers prescribe antibiotics to treat infections caused by bacteria. Antibiotics are very powerful in treating bacterial infections when they are used properly.  To maintain their effectiveness, they should be used only when necessary.  Overuse of antibiotics has resulted in the development of super bugs that are resistant to treatment!    Home  Care:  Only take medications as instructed by your medical team.  Do not drink alcohol while taking these medications.  A steam or ultrasonic humidifier can help congestion.  You can place a towel over your head and breathe in the steam from hot water coming from a faucet.  Avoid close contacts especially the very young and the elderly.  Cover your mouth when you cough or sneeze.  Always remember to wash your hands.  Get Help Right Away If:  You develop worsening fever or throat pain.  You develop a severe head ache or visual changes.  Your symptoms persist after you have completed your treatment plan.  Make sure you  Understand these instructions.  Will watch your condition.  Will get help right away if you are not doing well or get worse.  Your e-visit answers were reviewed by a board certified advanced clinical practitioner to complete your personal care plan.  Depending on the condition, your plan could have included both over the counter or prescription medications.  If there is a problem please reply  once you have received a response from your provider.  Your safety is important to [place].  If you have drug allergies check your prescription carefully.    You can use MyChart to ask questions about todays visit, request a non-urgent call back, or ask for a work or school excuse for 24 hours related to this e-Visit. If it has been greater than 24 hours you will need to follow up with your provider, or enter a new e-Visit  to address those concerns.  You will get an e-mail in the next two days asking about your experience.  I hope that your e-visit has been valuable and will speed your recovery. Thank you for using e-visits.   

## 2018-05-01 ENCOUNTER — Encounter: Payer: Self-pay | Admitting: Physician Assistant

## 2018-05-01 ENCOUNTER — Ambulatory Visit: Payer: Self-pay | Admitting: Physician Assistant

## 2018-05-01 VITALS — BP 105/65 | HR 63 | Temp 97.6°F | Resp 16 | Wt 225.6 lb

## 2018-05-01 DIAGNOSIS — J0100 Acute maxillary sinusitis, unspecified: Secondary | ICD-10-CM

## 2018-05-01 MED ORDER — DOXYCYCLINE HYCLATE 100 MG PO TABS: 100.0000 mg | ORAL_TABLET | Freq: Two times a day (BID) | ORAL | 0 refills | Status: DC

## 2018-05-01 MED ORDER — SALINE SPRAY 0.65 % NA SOLN: 1.0000 | NASAL | 0 refills | Status: DC | PRN

## 2018-05-01 NOTE — Patient Instructions (Addendum)
Sinusitis, Adult   Take doxycycline as prescribed.  Use nasal saline rinses and over the counter sudafed for congestion.  If no improvement in 7 days, contact your family doctor. If any symptoms worsen or you develop any new concerning symptoms, seek care immediately at ED.   While taking Doxycycline:  -Do not drink milk or take iron supplements, multivitamins, calcium supplements, antacids, laxatives within 2 hours before or after taking doxycycline. -Avoid direct exposure to sunlight or tanning beds. Doxycycline can make you sunburn more easily. Wear protective clothing and use sunscreen (SPF 30 or higher) when you are outdoors. -Antibiotic medicines can cause diarrhea, which may be a sign of a new infection. If you have diarrhea that is watery or bloody, stop taking this medicine and seek medical care.   Sinusitis is soreness and swelling (inflammation) of your sinuses. Sinuses are hollow spaces in the bones around your face. They are located:  Around your eyes.  In the middle of your forehead.  Behind your nose.  In your cheekbones. Your sinuses and nasal passages are lined with a fluid called mucus. Mucus drains out of your sinuses. Swelling can trap mucus in your sinuses. This lets germs (bacteria, virus, or fungus) grow, which leads to infection. Most of the time, this condition is caused by a virus. What are the causes? This condition is caused by:  Allergies.  Asthma.  Germs.  Things that block your nose or sinuses.  Growths in the nose (nasal polyps).  Chemicals or irritants in the air.  Fungus (rare). What increases the risk? You are more likely to develop this condition if:  You have a weak body defense system (immune system).  You do a lot of swimming or diving.  You use nasal sprays too much.  You smoke. What are the signs or symptoms? The main symptoms of this condition are pain and a feeling of pressure around the sinuses. Other symptoms  include:  Stuffy nose (congestion).  Runny nose (drainage).  Swelling and warmth in the sinuses.  Headache.  Toothache.  A cough that may get worse at night.  Mucus that collects in the throat or the back of the nose (postnasal drip).  Being unable to smell and taste.  Being very tired (fatigue).  A fever.  Sore throat.  Bad breath. How is this diagnosed? This condition is diagnosed based on:  Your symptoms.  Your medical history.  A physical exam.  Tests to find out if your condition is short-term (acute) or long-term (chronic). Your doctor may: ? Check your nose for growths (polyps). ? Check your sinuses using a tool that has a light (endoscope). ? Check for allergies or germs. ? Do imaging tests, such as an MRI or CT scan. How is this treated? Treatment for this condition depends on the cause and whether it is short-term or long-term.  If caused by a virus, your symptoms should go away on their own within 10 days. You may be given medicines to relieve symptoms. They include: ? Medicines that shrink swollen tissue in the nose. ? Medicines that treat allergies (antihistamines). ? A spray that treats swelling of the nostrils. ? Rinses that help get rid of thick mucus in your nose (nasal saline washes).  If caused by bacteria, your doctor may wait to see if you will get better without treatment. You may be given antibiotic medicine if you have: ? A very bad infection. ? A weak body defense system.  If caused by growths in  the nose, you may need to have surgery. Follow these instructions at home: Medicines  Take, use, or apply over-the-counter and prescription medicines only as told by your doctor. These may include nasal sprays.  If you were prescribed an antibiotic medicine, take it as told by your doctor. Do not stop taking the antibiotic even if you start to feel better. Hydrate and humidify   Drink enough water to keep your pee (urine) pale  yellow.  Use a cool mist humidifier to keep the humidity level in your home above 50%.  Breathe in steam for 10-15 minutes, 3-4 times a day, or as told by your doctor. You can do this in the bathroom while a hot shower is running.  Try not to spend time in cool or dry air. Rest  Rest as much as you can.  Sleep with your head raised (elevated).  Make sure you get enough sleep each night. General instructions   Put a warm, moist washcloth on your face 3-4 times a day, or as often as told by your doctor. This will help with discomfort.  Wash your hands often with soap and water. If there is no soap and water, use hand sanitizer.  Do not smoke. Avoid being around people who are smoking (secondhand smoke).  Keep all follow-up visits as told by your doctor. This is important. Contact a doctor if:  You have a fever.  Your symptoms get worse.  Your symptoms do not get better within 10 days. Get help right away if:  You have a very bad headache.  You cannot stop throwing up (vomiting).  You have very bad pain or swelling around your face or eyes.  You have trouble seeing.  You feel confused.  Your neck is stiff.  You have trouble breathing. Summary  Sinusitis is swelling of your sinuses. Sinuses are hollow spaces in the bones around your face.  This condition is caused by tissues in your nose that become inflamed or swollen. This traps germs. These can lead to infection.  If you were prescribed an antibiotic medicine, take it as told by your doctor. Do not stop taking it even if you start to feel better.  Keep all follow-up visits as told by your doctor. This is important. This information is not intended to replace advice given to you by your health care provider. Make sure you discuss any questions you have with your health care provider. Document Released: 09/07/2007 Document Revised: 08/21/2017 Document Reviewed: 08/21/2017 Elsevier Interactive Patient Education   2019 [employer].

## 2018-05-01 NOTE — Progress Notes (Addendum)
MRN: [id number] DOB: 09/08/1985  Subjective:   Bridget Rich is a 33 y.o. female presenting for chief complaint of Sinus Problem (X 16 DAYS); Sore Throat (X 16 DAYS); Ear Pain (X 16 DAYS); and Headache (X 16 DAYS) .  Reports 16 day history of illness. Started out with nasal congestion, scratchy throat, ear fullness, cough, and sinus pressure.  Since then, symptoms have worsened.  Nasal drainage is yellowish-green in color.  Cough is thought to be coming from postnasal drainage.  His decreased sense of smell.  Felt feverish a couple days but that has since resolved.  Denies visual disturbance, pain with swallowing, inability to swallow, voice change, wheezing, shortness of breath, chest pain and myalgia, nausea, vomiting, abdominal pain and diarrhea.  Did an E visit on 04/20/2018 and was treated for viral pharyngitis.  Given Astelin nasal spray, which he has been taking.  Has also been driving over-the-counter Mucinex and Tylenol.  No known sick contact exposure. No PMH of seasonal allergies or asthma. Denies smoking.  She is confident she is not pregnant, has PCOS and IUD in place.  Denies any other aggravating or relieving factors, no other questions or concerns.  Review of Systems  HENT: Negative for tinnitus.   Respiratory: Negative for hemoptysis.   Skin: Negative for rash.  Neurological: Negative for dizziness and weakness.    Bridget Rich has a current medication list which includes the following prescription(s): azelastine, biotin w/ vitamins c & e, calcium carbonate, vitamin d, clonazepam, cyanocobalamin, inositol-d chiro-inositol, fish oil, omeprazole, oxcarbazepine, prenatal vit-fe fumarate-fa, probiotic product, vitamin b6, and vitamin c. Also is allergic to augmentin [amoxicillin-pot clavulanate]; ceclor [cefaclor]; and other.  Bridget Rich  has a past medical history of Anxiety and PCOS (polycystic ovarian syndrome). Also  has a past surgical history that includes Bariatric Surgery (12/2016)  and Mouth surgery.   Objective:   Vitals: BP 105/65 (BP Location: Right Arm, Patient Position: Sitting, Cuff Size: Normal)   Pulse 63   Temp 97.6 F (36.4 C) (Oral)   Resp 16   Wt 225 lb 9.6 oz (102.3 kg)   SpO2 97%   BMI 34.81 kg/m   Physical Exam Vitals signs reviewed.  Constitutional:      General: She is not in acute distress.    Appearance: She is well-developed. She is not ill-appearing or toxic-appearing.  HENT:     Head: Normocephalic and atraumatic.     Right Ear: Ear canal and external ear normal. A middle ear effusion is present. No mastoid tenderness. Tympanic membrane is not erythematous or bulging.     Left Ear: Ear canal and external ear normal. A middle ear effusion is present. No mastoid tenderness. Tympanic membrane is not erythematous or bulging.     Nose: Mucosal edema and congestion present.     Right Sinus: Maxillary sinus tenderness present. No frontal sinus tenderness.     Left Sinus: Maxillary sinus tenderness present. No frontal sinus tenderness.     Mouth/Throat:     Lips: Pink.     Mouth: Mucous membranes are moist.     Pharynx: Oropharynx is clear. Uvula midline. No posterior oropharyngeal erythema.     Tonsils: No tonsillar exudate or tonsillar abscesses. Swelling: 1+ on the right. 1+ on the left.  Eyes:     Extraocular Movements: Extraocular movements intact.     Conjunctiva/sclera: Conjunctivae normal.     Pupils: Pupils are equal, round, and reactive to light.  Neck:     Musculoskeletal: Normal range of  motion.  Cardiovascular:     Rate and Rhythm: Normal rate and regular rhythm.     Heart sounds: Normal heart sounds.  Pulmonary:     Effort: Pulmonary effort is normal.     Breath sounds: Normal breath sounds. No decreased breath sounds, wheezing, rhonchi or rales.  Lymphadenopathy:     Head:     Right side of head: No submental, submandibular, tonsillar, preauricular, posterior auricular or occipital adenopathy.     Left side of head: No  submental, submandibular, tonsillar, preauricular, posterior auricular or occipital adenopathy.     Cervical: No cervical adenopathy.     Upper Body:     Right upper body: No supraclavicular adenopathy.     Left upper body: No supraclavicular adenopathy.  Skin:    General: Skin is warm and dry.  Neurological:     Mental Status: She is alert.     Cranial Nerves: Cranial nerves are intact.     No results found for this or any previous visit (from the past 24 hour(s)).  Assessment and Plan :  1. Acute maxillary sinusitis, recurrence not specified Patient is overall well appearing, NAD. VSS. No acute findings on neuro exam. Lungs CTAB. Due to duration, symptoms, and no improvement with conservative management, will treat with oral antibiotics at this time. Recommend continuing symptomatic treatment as well, add on sudafed and nasal saline rinses. Advised to f/u with family doctor if no improvement with treatment plan or to seek care sooner if symptoms worsen/develops new concerning symptoms. Patient voices understanding.  - doxycycline (VIBRA-TABS) 100 MG tablet; Take 1 tablet (100 mg total) by mouth 2 (two) times daily.  Dispense: 20 tablet; Refill: 0 - sodium chloride (OCEAN) 0.65 % SOLN nasal spray; Place 1 spray into both nostrils as needed for congestion.  Dispense: 60 mL; Refill: 0  Tenna Delaine, [facility] 05/01/2018 2:34 PM

## 2018-05-08 ENCOUNTER — Encounter: Payer: Self-pay | Admitting: Family Medicine

## 2018-05-08 ENCOUNTER — Ambulatory Visit: Payer: Commercial Managed Care - PPO | Admitting: Mental Health

## 2018-05-08 ENCOUNTER — Ambulatory Visit: Payer: Commercial Managed Care - PPO | Admitting: Family Medicine

## 2018-05-08 VITALS — BP 105/67 | HR 73 | Temp 98.1°F | Resp 16 | Ht 68.5 in | Wt 222.1 lb

## 2018-05-08 DIAGNOSIS — Z130 Encounter for screening for diseases of the blood and blood-forming organs and certain disorders involving the immune mechanism: Secondary | ICD-10-CM

## 2018-05-08 DIAGNOSIS — Z131 Encounter for screening for diabetes mellitus: Secondary | ICD-10-CM

## 2018-05-08 DIAGNOSIS — Z114 Encounter for screening for human immunodeficiency virus [HIV]: Secondary | ICD-10-CM

## 2018-05-08 DIAGNOSIS — Z0000 Encounter for general adult medical examination without abnormal findings: Secondary | ICD-10-CM | POA: Diagnosis not present

## 2018-05-08 DIAGNOSIS — E669 Obesity, unspecified: Secondary | ICD-10-CM

## 2018-05-08 DIAGNOSIS — F419 Anxiety disorder, unspecified: Secondary | ICD-10-CM

## 2018-05-08 DIAGNOSIS — Z79899 Other long term (current) drug therapy: Secondary | ICD-10-CM

## 2018-05-08 DIAGNOSIS — Z23 Encounter for immunization: Secondary | ICD-10-CM

## 2018-05-08 DIAGNOSIS — Z975 Presence of (intrauterine) contraceptive device: Secondary | ICD-10-CM
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
## 2018-05-08 NOTE — Patient Instructions (Signed)
Health Maintenance, Female Adopting a healthy lifestyle and getting preventive care can go a long way to promote health and wellness. Talk with your health care provider about what schedule of regular examinations is right for you. This is a good chance for you to check in with your provider about disease prevention and staying healthy. In between checkups, there are plenty of things you can do on your own. Experts have done a lot of research about which lifestyle changes and preventive measures are most likely to keep you healthy. Ask your health care provider for more information. Weight and diet Eat a healthy diet  Be sure to include plenty of vegetables, fruits, low-fat dairy products, and lean protein.  Do not eat a lot of foods high in solid fats, added sugars, or salt.  Get regular exercise. This is one of the most important things you can do for your health. ? Most adults should exercise for at least 150 minutes each week. The exercise should increase your heart rate and make you sweat (moderate-intensity exercise). ? Most adults should also do strengthening exercises at least twice a week. This is in addition to the moderate-intensity exercise. Maintain a healthy weight  Body mass index (BMI) is a measurement that can be used to identify possible weight problems. It estimates body fat based on height and weight. Your health care provider can help determine your BMI and help you achieve or maintain a healthy weight.  For females 20 years of age and older: ? A BMI below 18.5 is considered underweight. ? A BMI of 18.5 to 24.9 is normal. ? A BMI of 25 to 29.9 is considered overweight. ? A BMI of 30 and above is considered obese. Watch levels of cholesterol and blood lipids  You should start having your blood tested for lipids and cholesterol at 32 years of age, then have this test every 5 years.  You may need to have your cholesterol levels checked more often if: ? Your lipid or  cholesterol levels are high. ? You are older than 32 years of age. ? You are at high risk for heart disease. Cancer screening Lung Cancer  Lung cancer screening is recommended for adults 55-80 years old who are at high risk for lung cancer because of a history of smoking.  A yearly low-dose CT scan of the lungs is recommended for people who: ? Currently smoke. ? Have quit within the past 15 years. ? Have at least a 30-pack-year history of smoking. A pack year is smoking an average of one pack of cigarettes a day for 1 year.  Yearly screening should continue until it has been 15 years since you quit.  Yearly screening should stop if you develop a health problem that would prevent you from having lung cancer treatment. Breast Cancer  Practice breast self-awareness. This means understanding how your breasts normally appear and feel.  It also means doing regular breast self-exams. Let your health care provider know about any changes, no matter how small.  If you are in your 20s or 30s, you should have a clinical breast exam (CBE) by a health care provider every 1-3 years as part of a regular health exam.  If you are 40 or older, have a CBE every year. Also consider having a breast X-ray (mammogram) every year.  If you have a family history of breast cancer, talk to your health care provider about genetic screening.  If you are at high risk for breast cancer, talk   to your health care provider about having an MRI and a mammogram every year.  Breast cancer gene (BRCA) assessment is recommended for women who have family members with BRCA-related cancers. BRCA-related cancers include: ? Breast. ? Ovarian. ? Tubal. ? Peritoneal cancers.  Results of the assessment will determine the need for genetic counseling and BRCA1 and BRCA2 testing. Cervical Cancer Your health care provider may recommend that you be screened regularly for cancer of the pelvic organs (ovaries, uterus, and vagina).  This screening involves a pelvic examination, including checking for microscopic changes to the surface of your cervix (Pap test). You may be encouraged to have this screening done every 3 years, beginning at age 21.  For women ages 30-65, health care providers may recommend pelvic exams and Pap testing every 3 years, or they may recommend the Pap and pelvic exam, combined with testing for human papilloma virus (HPV), every 5 years. Some types of HPV increase your risk of cervical cancer. Testing for HPV may also be done on women of any age with unclear Pap test results.  Other health care providers may not recommend any screening for nonpregnant women who are considered low risk for pelvic cancer and who do not have symptoms. Ask your health care provider if a screening pelvic exam is right for you.  If you have had past treatment for cervical cancer or a condition that could lead to cancer, you need Pap tests and screening for cancer for at least 20 years after your treatment. If Pap tests have been discontinued, your risk factors (such as having a new sexual partner) need to be reassessed to determine if screening should resume. Some women have medical problems that increase the chance of getting cervical cancer. In these cases, your health care provider may recommend more frequent screening and Pap tests. Colorectal Cancer  This type of cancer can be detected and often prevented.  Routine colorectal cancer screening usually begins at 32 years of age and continues through 32 years of age.  Your health care provider may recommend screening at an earlier age if you have risk factors for colon cancer.  Your health care provider may also recommend using home test kits to check for hidden blood in the stool.  A small camera at the end of a tube can be used to examine your colon directly (sigmoidoscopy or colonoscopy). This is done to check for the earliest forms of colorectal cancer.  Routine  screening usually begins at age 50.  Direct examination of the colon should be repeated every 5-10 years through 32 years of age. However, you may need to be screened more often if early forms of precancerous polyps or small growths are found. Skin Cancer  Check your skin from head to toe regularly.  Tell your health care provider about any new moles or changes in moles, especially if there is a change in a mole's shape or color.  Also tell your health care provider if you have a mole that is larger than the size of a pencil eraser.  Always use sunscreen. Apply sunscreen liberally and repeatedly throughout the day.  Protect yourself by wearing long sleeves, pants, a wide-brimmed hat, and sunglasses whenever you are outside. Heart disease, diabetes, and high blood pressure  High blood pressure causes heart disease and increases the risk of stroke. High blood pressure is more likely to develop in: ? People who have blood pressure in the high end of the normal range (130-139/85-89 mm Hg). ? People   who are overweight or obese. ? People who are African American.  If you are 84-22 years of age, have your blood pressure checked every 3-5 years. If you are 67 years of age or older, have your blood pressure checked every year. You should have your blood pressure measured twice-once when you are at a hospital or clinic, and once when you are not at a hospital or clinic. Record the average of the two measurements. To check your blood pressure when you are not at a hospital or clinic, you can use: ? An automated blood pressure machine at a pharmacy. ? A home blood pressure monitor.  If you are between 52 years and 3 years old, ask your health care provider if you should take aspirin to prevent strokes.  Have regular diabetes screenings. This involves taking a blood sample to check your fasting blood sugar level. ? If you are at a normal weight and have a low risk for diabetes, have this test once  every three years after 32 years of age. ? If you are overweight and have a high risk for diabetes, consider being tested at a younger age or more often. Preventing infection Hepatitis B  If you have a higher risk for hepatitis B, you should be screened for this virus. You are considered at high risk for hepatitis B if: ? You were born in a country where hepatitis B is common. Ask your health care provider which countries are considered high risk. ? Your parents were born in a high-risk country, and you have not been immunized against hepatitis B (hepatitis B vaccine). ? You have HIV or AIDS. ? You use needles to inject street drugs. ? You live with someone who has hepatitis B. ? You have had sex with someone who has hepatitis B. ? You get hemodialysis treatment. ? You take certain medicines for conditions, including cancer, organ transplantation, and autoimmune conditions. Hepatitis C  Blood testing is recommended for: ? Everyone born from [birth year] through 1964. ? Anyone with known risk factors for hepatitis C. Sexually transmitted infections (STIs)  You should be screened for sexually transmitted infections (STIs) including gonorrhea and chlamydia if: ? You are sexually active and are younger than 32 years of age. ? You are older than 32 years of age and your health care provider tells you that you are at risk for this type of infection. ? Your sexual activity has changed since you were last screened and you are at an increased risk for chlamydia or gonorrhea. Ask your health care provider if you are at risk.  If you do not have HIV, but are at risk, it may be recommended that you take a prescription medicine daily to prevent HIV infection. This is called pre-exposure prophylaxis (PrEP). You are considered at risk if: ? You are sexually active and do not regularly use condoms or know the HIV status of your partner(s). ? You take drugs by injection. ? You are sexually active with a partner  who has HIV. Talk with your health care provider about whether you are at high risk of being infected with HIV. If you choose to begin PrEP, you should first be tested for HIV. You should then be tested every 3 months for as long as you are taking PrEP. Pregnancy  If you are premenopausal and you may become pregnant, ask your health care provider about preconception counseling.  If you may become pregnant, take 400 to 800 micrograms (mcg) of folic acid every  day.  If you want to prevent pregnancy, talk to your health care provider about birth control (contraception). Osteoporosis and menopause  Osteoporosis is a disease in which the bones lose minerals and strength with aging. This can result in serious bone fractures. Your risk for osteoporosis can be identified using a bone density scan.  If you are 64 years of age or older, or if you are at risk for osteoporosis and fractures, ask your health care provider if you should be screened.  Ask your health care provider whether you should take a calcium or vitamin D supplement to lower your risk for osteoporosis.  Menopause may have certain physical symptoms and risks.  Hormone replacement therapy may reduce some of these symptoms and risks. Talk to your health care provider about whether hormone replacement therapy is right for you. Follow these instructions at home:  Schedule regular health, dental, and eye exams.  Stay current with your immunizations.  Do not use any tobacco products including cigarettes, chewing tobacco, or electronic cigarettes.  If you are pregnant, do not drink alcohol.  If you are breastfeeding, limit how much and how often you drink alcohol.  Limit alcohol intake to no more than 1 drink per day for nonpregnant women. One drink equals 12 ounces of beer, 5 ounces of wine, or 1 ounces of hard liquor.  Do not use street drugs.  Do not share needles.  Ask your health care provider for help if you need support  or information about quitting drugs.  Tell your health care provider if you often feel depressed.  Tell your health care provider if you have ever been abused or do not feel safe at home. This information is not intended to replace advice given to you by your health care provider. Make sure you discuss any questions you have with your health care provider. Document Released: 10/04/2010 Document Revised: 08/27/2015 Document Reviewed: 12/23/2014 Elsevier Interactive Patient Education  2019 [employer].   Please help [place] help you:  We are honored you have chosen [facility] for your Primary Care home. Below you will find basic instructions that you may need to access in the future. Please help [place] help you by reading the instructions, which cover many of the frequent questions we experience.   Prescription refills and request:  -In order to allow more efficient response time, please call your pharmacy for all refills. They will forward the request electronically to [place]. This allows for the quickest possible response. Request left on a nurse line can take longer to refill, since these are checked as time allows between office patients and other phone calls.  - refill request can take up to 3-5 working days to complete.  - If request is sent electronically and request is appropiate, it is usually completed in 1-2 business days.  - all patients will need to be seen routinely for all chronic medical conditions requiring prescription medications (see follow-up below). If you are overdue for follow up on your condition, you will be asked to make an appointment and we will call in enough medication to cover you until your appointment (up to 30 days).  - all controlled substances will require a face to face visit to request/refill.  - if you desire your prescriptions to go through a new pharmacy, and have an active script at original pharmacy, you will need to call your pharmacy and have scripts  transferred to new pharmacy. This is completed between the pharmacy locations and not  by your provider.    Results: If any images or labs were ordered, it can take up to 1 week to get results depending on the test ordered and the lab/facility running and resulting the test. - Normal or stable results, which do not need further discussion, may be released to your mychart immediately with attached note to you. A call may not be generated for normal results. Please make certain to sign up for mychart. If you have questions on how to activate your mychart you can call the front office.  - If your results need further discussion, our office will attempt to contact you via phone, and if unable to reach you after 2 attempts, we will release your abnormal result to your mychart with instructions.  - All results will be automatically released in mychart after 1 week.  - Your provider will provide you with explanation and instruction on all relevant material in your results. Please keep in mind, results and labs may appear confusing or abnormal to the untrained eye, but it does not mean they are actually abnormal for you personally. If you have any questions about your results that are not covered, or you desire more detailed explanation than what was provided, you should make an appointment with your provider to do so.   Our office handles many outgoing and incoming calls daily. If we have not contacted you within 1 week about your results, please check your mychart to see if there is a message first and if not, then contact our office.  In helping with this matter, you help decrease call volume, and therefore allow [place] to be able to respond to patients needs more efficiently.   Acute office visits (sick visit):  An acute visit is intended for a new problem and are scheduled in shorter time slots to allow schedule openings for patients with new problems. This is the appropriate visit to discuss a new problem.  Problems will not be addressed by phone call or Echart message. Appointment is needed if requesting treatment. In order to provide you with excellent quality medical care with proper time for you to explain your problem, have an exam and receive treatment with instructions, these appointments should be limited to one new problem per visit. If you experience a new problem, in which you desire to be addressed, please make an acute office visit, we save openings on the schedule to accommodate you. Please do not save your new problem for any other type of visit, let us take care of it properly and quickly for you.   Follow up visits:  Depending on your condition(s) your provider will need to see you routinely in order to provide you with quality care and prescribe medication(s). Most chronic conditions (Example: hypertension, Diabetes, depression/anxiety... etc), require visits a couple times a year. Your provider will instruct you on proper follow up for your personal medical conditions and history. Please make certain to make follow up appointments for your condition as instructed. Failing to do so could result in lapse in your medication treatment/refills. If you request a refill, and are overdue to be seen on a condition, we will always provide you with a 30 day script (once) to allow you time to schedule.    Medicare wellness (well visit): - we have a wonderful Nurse Maudie Mercury), that will meet with you and provide you will yearly medicare wellness visits. These visits should occur yearly (can not be scheduled less than 1 calendar year apart) and cover  preventive health, immunizations, advance directives and screenings you are entitled to yearly through your medicare benefits. Do not miss out on your entitled benefits, this is when medicare will pay for these benefits to be ordered for you.  These are strongly encouraged by your provider and is the appropriate type of visit to make certain you are up to date with  all preventive health benefits. If you have not had your medicare wellness exam in the last 12 months, please make certain to schedule one by calling the office and schedule your medicare wellness with Maudie Mercury as soon as possible.   Yearly physical (well visit):  - Adults are recommended to be seen yearly for physicals. Check with your insurance and date of your last physical, most insurances require one calendar year between physicals. Physicals include all preventive health topics, screenings, medical exam and labs that are appropriate for gender/age and history. You may have fasting labs needed at this visit. This is a well visit (not a sick visit), new problems should not be covered during this visit (see acute visit).  - Pediatric patients are seen more frequently when they are younger. Your provider will advise you on well child visit timing that is appropriate for your their age. - This is not a medicare wellness visit. Medicare wellness exams do not have an exam portion to the visit. Some medicare companies allow for a physical, some do not allow a yearly physical. If your medicare allows a yearly physical you can schedule the medicare wellness with our nurse Maudie Mercury and have your physical with your provider after, on the same day. Please check with insurance for your full benefits.   Late Policy/No Shows:  - all new patients should arrive 15-30 minutes earlier than appointment to allow [place] time  to  obtain all personal demographics,  insurance information and for you to complete office paperwork. - All established patients should arrive 10-15 minutes earlier than appointment time to update all information and be checked in .  - In our best efforts to run on time, if you are late for your appointment you will be asked to either reschedule or if able, we will work you back into the schedule. There will be a wait time to work you back in the schedule,  depending on availability.  - If you are unable to make  it to your appointment as scheduled, please call 24 hours ahead of time to allow [place] to fill the time slot with someone else who needs to be seen. If you do not cancel your appointment ahead of time, you may be charged a no show fee.
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
## 2018-05-11 ENCOUNTER — Encounter: Payer: Self-pay | Admitting: Family Medicine

## 2018-05-28 ENCOUNTER — Ambulatory Visit: Payer: Commercial Managed Care - PPO | Admitting: Mental Health

## 2018-05-28 DIAGNOSIS — F419 Anxiety disorder, unspecified: Secondary | ICD-10-CM | POA: Diagnosis not present
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
## 2018-05-30 ENCOUNTER — Ambulatory Visit: Payer: Commercial Managed Care - PPO | Admitting: Mental Health

## 2018-06-04 ENCOUNTER — Ambulatory Visit: Payer: Commercial Managed Care - PPO | Admitting: Mental Health

## 2018-06-04 DIAGNOSIS — F419 Anxiety disorder, unspecified: Secondary | ICD-10-CM

## 2018-06-04 NOTE — Progress Notes (Signed)
Psychotherapy note  Name: Bridget Rich Date: 06/04/2018 MRN: [id number] DOB: [birth year] PCP: Howard Pouch A, DO  Time spent:  53 minutes  Guardian/Payee:  none  Paperwork requested:  none  Mental Status Exam:   Appearance:   Casual     Behavior:  Appropriate  Motor:  Normal  Speech/Language:   Clear and Coherent  Affect:  Full Range  Mood:  anxious  Thought process:  normal  Thought content:    WNL  Sensory/Perceptual disturbances:    WNL  Orientation:  x4  Attention:  Good  Concentration:  Good  Memory:  WNL  Fund of knowledge:   Good  Insight:    good  Judgment:   Good  Impulse Control:  Good   Reported Symptoms:  Panic attacks, Obsessive thinking, Sleep disturbance and daily anxiety  (sleep well-managed w/ medication currently)  Risk Assessment: Danger to Self:  No Self-injurious Behavior: No Danger to Others: No Duty to Warn:no Physical Aggression / Violence:No  Access to Firearms a concern: No  Gang Involvement:No  Patient / guardian was educated about steps to take if suicide or homicide risk level increases between visits: yes While future psychiatric events cannot be accurately predicted, the patient does not currently require acute inpatient psychiatric care and does not currently meet [facility] involuntary commitment criteria.  Medical History/Surgical History:reviewed Past Medical History:  Diagnosis Date  . Chicken pox   . Depression with anxiety   . GERD (gastroesophageal reflux disease)   . IUD (intrauterine device) in place   . PCOS (polycystic ovarian syndrome)     Past Surgical History:  Procedure Laterality Date  . BARIATRIC SURGERY  12/2016  . MOUTH SURGERY  2016     Subjective: Patient arrived on time for today's session.  She shared recent events, how she and her boyfriend were able to visit [place] on a recent vacation trip.  She shared more family history related to her relationship with her mother and father.  She  continues to live with her father she stated they generally coexist fine, avoiding more deeper discussions typically talk about day-to-day experiences.  She shared how she had some anxiety and panic on the way home from [place] with her boyfriend.  She took her medication as prescribed and this was helpful.  We discussed some coping tools, such as diaphragmatic breathing and mindfulness.  She agreed to utilize between sessions.  Interventions: CBT, problem sovling   Plan: 1.  Patient to continue to engage in individual counseling 2-4 times a month or as needed. 2.  Patient to identify and apply CBT, coping skills learned in session to decrease depression and anxiety symptoms. 3.  Patient to improve relationships, improve self-esteem. 3.  Patient to contact this office, go to the local ED or call 911 if a crisis or emergency develops between visits.  Anson [place], LPC

## 2018-06-14 ENCOUNTER — Telehealth: Payer: Self-pay | Admitting: Family Medicine

## 2018-06-14 NOTE — Telephone Encounter (Signed)
LMOM -  Dr Raoul Pitch does not provider preventative tamiflu.   If patient was dx with flu - whoever dx her should be able to provide tamiflu.   ( message was unclear if patient had the flu or was only exposed )  Okay for [place] to relay message if patient calls back.

## 2018-06-14 NOTE — Telephone Encounter (Signed)
Copied from [place] [id number]. Topic: Quick Communication - See Telephone Encounter >> Jun 14, 2018 10:20 AM Ahmed Prima L wrote: CRM for notification. See Telephone encounter for: 06/14/18.  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

## 2018-06-18 ENCOUNTER — Ambulatory Visit: Payer: Commercial Managed Care - PPO | Admitting: Mental Health

## 2018-07-02 ENCOUNTER — Ambulatory Visit (INDEPENDENT_AMBULATORY_CARE_PROVIDER_SITE_OTHER): Payer: Commercial Managed Care - PPO | Admitting: Mental Health

## 2018-07-02 ENCOUNTER — Other Ambulatory Visit: Payer: Self-pay

## 2018-07-02 DIAGNOSIS — F419 Anxiety disorder, unspecified: Secondary | ICD-10-CM
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
## 2018-07-09 ENCOUNTER — Ambulatory Visit: Payer: Commercial Managed Care - PPO | Admitting: Mental Health

## 2018-07-23 ENCOUNTER — Ambulatory Visit: Payer: Commercial Managed Care - PPO | Admitting: Mental Health

## 2018-08-06 ENCOUNTER — Ambulatory Visit: Payer: Commercial Managed Care - PPO | Admitting: Mental Health

## 2018-08-09 ENCOUNTER — Other Ambulatory Visit: Payer: Self-pay | Admitting: Psychiatry

## 2018-08-09 DIAGNOSIS — F419 Anxiety disorder, unspecified: Secondary | ICD-10-CM

## 2018-08-09 DIAGNOSIS — F5105 Insomnia due to other mental disorder: Secondary | ICD-10-CM

## 2018-08-10 ENCOUNTER — Ambulatory Visit (INDEPENDENT_AMBULATORY_CARE_PROVIDER_SITE_OTHER): Payer: Commercial Managed Care - PPO | Admitting: Psychiatry

## 2018-08-10 ENCOUNTER — Encounter: Payer: Self-pay | Admitting: Psychiatry

## 2018-08-10 ENCOUNTER — Other Ambulatory Visit: Payer: Self-pay

## 2018-08-10 DIAGNOSIS — F419 Anxiety disorder, unspecified: Secondary | ICD-10-CM | POA: Diagnosis not present

## 2018-08-10 DIAGNOSIS — F5105 Insomnia due to other mental disorder: Secondary | ICD-10-CM

## 2018-08-10 MED ORDER — OXCARBAZEPINE 300 MG PO TABS: 450.0000 mg | ORAL_TABLET | Freq: Every day | ORAL | 1 refills | Status: DC

## 2018-08-10 MED ORDER — CLONAZEPAM 0.5 MG PO TABS: ORAL_TABLET | ORAL | 5 refills | Status: DC

## 2018-08-10 NOTE — Progress Notes (Signed)
Bridget Rich [id number] [birth year] 33 y.o.  Virtual Visit via Telephone Note  I connected with pt on 08/10/18 at  8:30 AM EDT by telephone and verified that I am speaking with the correct person using two identifiers.   I discussed the limitations, risks, security and privacy concerns of performing an evaluation and management service by telephone and the availability of in person appointments. I also discussed with the patient that there may be a patient responsible charge related to this service. The patient expressed understanding and agreed to proceed.   I discussed the assessment and treatment plan with the patient. The patient was provided an opportunity to ask questions and all were answered. The patient agreed with the plan and demonstrated an understanding of the instructions.   The patient was advised to call back or seek an in-person evaluation if the symptoms worsen or if the condition fails to improve as anticipated.  I provided 30 minutes of non-face-to-face time during this encounter.  The patient was located at home.  The provider was located at home.   Thayer Headings, PMHNP   Subjective:   Patient ID:  Bridget Rich is a 33 y.o. (DOB [birth year]) female.  Chief Complaint:  Chief Complaint  Patient presents with  . Follow-up    h/o mood disturbance and anxiety    HPI Bridget Rich presents for follow-up of mood and anxiety. She reports "I've been better in a lot of regards." Reports that her pay was reduced by 20%. Reports that she is engaged and wedding had to be postponed due to [place]. She reports that her anxiety has been "ok." Denies any recent panic attacks. She reports that her mood has been "ok." She reports that she has been taking 1.5 Trileptal at HS. She reports that she has not been interacting as much with others and at times thinks it is because she is listening to audiobooks and doing personal reflection. She reports that she is less irritable.  She reports that she has been sleeping well and averages about 7 hours a night. Appetite has been normal. Has felt energy is lower after recent stressors. Working out daily and going on walks. She reports that her motivation is somewhat decreased at work after pay cut. She reports that concentration has been adequate. Denies SI.  She reports that she has been shopping some online and that there might be some increase in shopping. Reports that this may be due to increased accessibility and not excessive.  Reports that she saw Lanetta Inch, [facility] and transitioned back to the therapist she has seen in the past.   She reports taking Klonopin 2 mg po QHS.   Reports that she had one episode of drinking after learning that her pay was being reduced.   Review of Systems:  Review of Systems  Musculoskeletal: Negative for gait problem.  Neurological: Negative for tremors.  Psychiatric/Behavioral:       Please refer to HPI    Medications: I have reviewed the patient's current medications.  Current Outpatient Medications  Medication Sig Dispense Refill  . Biotin w/ Vitamins C & E (HAIR/SKIN/NAILS PO) Take by mouth.    . Calcium Carbonate (CALCIUM 600 PO) Take by mouth 2 (two) times daily.    . Cholecalciferol (VITAMIN D) 2000 units CAPS Take by mouth 2 (two) times daily.    . clonazePAM (KLONOPIN) 0.5 MG tablet Take 1-2 tabs po QHS prn 60 tablet 5  . cyanocobalamin 1000 MCG tablet Take 1,000  mcg by mouth daily.    Blanchie Dessert Chiro-Inositol (OVASITOL) 2000-50 MG PACK Take by mouth. Taking for PCOS.    Marland Kitchen levonorgestrel (MIRENA) 20 MCG/24HR IUD 1 each by Intrauterine route once.    . Omega-3 Fatty Acids (FISH OIL) 1360 MG CAPS Take by mouth.    . Prenatal Vit-Fe Fumarate-FA (PRENATAL PO) Take by mouth.    . Probiotic Product (PROBIOTIC DAILY PO) Take by mouth.    . Pyridoxine HCl (VITAMIN B6) 200 MG TABS Take by mouth.    . sodium chloride (OCEAN) 0.65 % SOLN nasal spray Place 1 spray into both  nostrils as needed for congestion. 60 mL 0  . vitamin C (ASCORBIC ACID) 500 MG tablet Take 500 mg by mouth daily.    . Oxcarbazepine (TRILEPTAL) 300 MG tablet Take 1.5 tablets (450 mg total) by mouth at bedtime. 135 tablet 1   No current facility-administered medications for this visit.     Medication Side Effects: None  Allergies:  Allergies  Allergen Reactions  . Augmentin [Amoxicillin-Pot Clavulanate] Other (See Comments)    unknown  . Ceclor [Cefaclor] Other (See Comments)    unknown  . Other Other (See Comments)    IBU - s/p bariatric surgery    Past Medical History:  Diagnosis Date  . Chicken pox   . Depression with anxiety   . GERD (gastroesophageal reflux disease)   . IUD (intrauterine device) in place   . PCOS (polycystic ovarian syndrome)     Family History  Problem Relation Age of Onset  . Diabetes Father   . Depression Father   . Hypertension Father   . Hyperlipidemia Father   . Mental illness Father   . Anxiety disorder Sister   . Depression Maternal Grandfather   . Diabetes Maternal Grandfather   . Hearing loss Maternal Grandfather   . Heart disease Maternal Grandfather   . Kidney disease Maternal Grandfather   . Mental illness Maternal Grandfather   . Depression Maternal Grandmother   . Diabetes Maternal Grandmother   . COPD Paternal Grandfather   . Alcohol abuse Paternal Grandfather   . Asthma Paternal Grandfather   . Diabetes Paternal Grandfather   . Heart disease Paternal Grandfather   . Hyperlipidemia Paternal Grandfather   . Hypertension Paternal Grandfather   . Bipolar disorder Cousin   . Anxiety disorder Sister   . Hypertension Mother   . Lung cancer Paternal Grandmother   . Depression Paternal Grandmother     Social History   Socioeconomic History  . Marital status: Single    Spouse name: Not on file  . Number of children: Not on file  . Years of education: Not on file  . Highest education level: Not on file  Occupational  History  . Not on file  Social Needs  . Financial resource strain: Not on file  . Food insecurity:    Worry: Not on file    Inability: Not on file  . Transportation needs:    Medical: Not on file    Non-medical: Not on file  Tobacco Use  . Smoking status: Never Smoker  . Smokeless tobacco: Never Used  Substance and Sexual Activity  . Alcohol use: Yes    Comment: Occasionally 6-10 over a month   . Drug use: No  . Sexual activity: Yes    Partners: Male    Comment: iud  Lifestyle  . Physical activity:    Days per week: Not on file    Minutes per  session: Not on file  . Stress: Not on file  Relationships  . Social connections:    Talks on phone: Not on file    Gets together: Not on file    Attends religious service: Not on file    Active member of club or organization: Not on file    Attends meetings of clubs or organizations: Not on file    Relationship status: Not on file  . Intimate partner violence:    Fear of current or ex partner: Not on file    Emotionally abused: Not on file    Physically abused: Not on file    Forced sexual activity: Not on file  Other Topics Concern  . Not on file  Social History Narrative   Marital status/children/pets: single   Education/employment: Master's degree, Psychologist, clinical.    Safety:      -Wears a bicycle helmet riding a bike: Yes     -smoke alarm in the home:Yes     - wears seatbelt: Yes     - Feels safe in their relationships: Yes    Past Medical History, Surgical history, Social history, and Family history were reviewed and updated as appropriate.   Please see review of systems for further details on the patient's review from today.   Objective:   Physical Exam:  There were no vitals taken for this visit.  Physical Exam Neurological:     Mental Status: She is alert and oriented to person, place, and time.     Cranial Nerves: No dysarthria.  Psychiatric:        Attention and Perception: Attention normal.         Speech: Speech normal.        Behavior: Behavior is cooperative.        Thought Content: Thought content normal. Thought content is not paranoid or delusional. Thought content does not include homicidal or suicidal ideation. Thought content does not include homicidal or suicidal plan.        Cognition and Memory: Cognition and memory normal.        Judgment: Judgment normal.     Comments: Mood appropriate to content.     Lab Review:     Component Value Date/Time   NA 136 (A) 01/25/2017   K 4.1 01/25/2017   CL 102 11/20/2014 0243   CO2 25 11/20/2014 0041   GLUCOSE 103 (H) 11/20/2014 0243   BUN 16 11/20/2014 0243   CREATININE 0.6 01/25/2017   CREATININE 0.80 11/20/2014 0243   CALCIUM 9.3 11/20/2014 0041   AST 23 01/25/2017   ALT 24 01/25/2017   GFRNONAA >60 11/20/2014 0041   GFRAA >60 11/20/2014 0041       Component Value Date/Time   WBC 8.7 11/20/2014 0041   RBC 4.67 11/20/2014 0041   HGB 13.6 11/20/2014 0243   HCT 40.0 11/20/2014 0243   PLT 303 11/20/2014 0041   MCV 84.8 11/20/2014 0041   MCH 28.5 11/20/2014 0041   MCHC 33.6 11/20/2014 0041   RDW 13.4 11/20/2014 0041    No results found for: POCLITH, LITHIUM   No results found for: PHENYTOIN, PHENOBARB, VALPROATE, CBMZ   .res Assessment: Plan:   Continue Trileptal 450 mg at bedtime for mood stabilization, anxiety, and insomnia. Continue Klonopin 0.5 mg 1-2 tabs p.o. nightly as needed insomnia or anxiety. Recommend patient continue to see current therapist. Patient to follow-up with this provider in 6 months or sooner if clinically indicated.  Anxiety disorder, unspecified type -  Plan: clonazePAM (KLONOPIN) 0.5 MG tablet, Oxcarbazepine (TRILEPTAL) 300 MG tablet  Insomnia secondary to anxiety - Plan: clonazePAM (KLONOPIN) 0.5 MG tablet, Oxcarbazepine (TRILEPTAL) 300 MG tablet  Please see After Visit Summary for patient specific instructions.  Future Appointments  Date Time Provider [place]   02/12/2019  8:30 AM Thayer Headings, PMHNP [facility] None    No orders of the defined types were placed in this encounter.     -------------------------------

## 2018-09-13 ENCOUNTER — Telehealth: Payer: Self-pay | Admitting: Mental Health

## 2018-09-13 NOTE — Telephone Encounter (Signed)
Spoke w/ Pt on phone to inquire if she is in need of continued therapy in which she declined at this time. Encouraged her to call our practice with any ongoing treatment concerns/needs.

## 2018-11-03 NOTE — Progress Notes (Deleted)
[facility] [address], [place] [address] Phone: [phone] Subjective:    I'm seeing this patient by the request  of:    CC:   [id number]  Bridget Rich is a 33 y.o. female coming in with complaint of ***  Onset-  Location Duration-  Character- Aggravating factors- Reliving factors-  Therapies tried-  Severity-     Past Medical History:  Diagnosis Date  . Chicken pox   . Depression with anxiety   . GERD (gastroesophageal reflux disease)   . IUD (intrauterine device) in place   . PCOS (polycystic ovarian syndrome)    Past Surgical History:  Procedure Laterality Date  . BARIATRIC SURGERY  12/2016  . MOUTH SURGERY  2016   Social History   Socioeconomic History  . Marital status: Single    Spouse name: Not on file  . Number of children: Not on file  . Years of education: Not on file  . Highest education level: Not on file  Occupational History  . Not on file  Social Needs  . Financial resource strain: Not on file  . Food insecurity    Worry: Not on file    Inability: Not on file  . Transportation needs    Medical: Not on file    Non-medical: Not on file  Tobacco Use  . Smoking status: Never Smoker  . Smokeless tobacco: Never Used  Substance and Sexual Activity  . Alcohol use: Yes    Comment: Occasionally 6-10 over a month   . Drug use: No  . Sexual activity: Yes    Partners: Male    Comment: iud  Lifestyle  . Physical activity    Days per week: Not on file    Minutes per session: Not on file  . Stress: Not on file  Relationships  . Social Herbalist on phone: Not on file    Gets together: Not on file    Attends religious service: Not on file    Active member of club or organization: Not on file    Attends meetings of clubs or organizations: Not on file    Relationship status: Not on file  Other Topics Concern  . Not on file  Social History Narrative   Marital status/children/pets:  single   Education/employment: Master's degree, Psychologist, clinical.    Safety:      -Wears a bicycle helmet riding a bike: Yes     -smoke alarm in the home:Yes     - wears seatbelt: Yes     - Feels safe in their relationships: Yes   Allergies  Allergen Reactions  . Augmentin [Amoxicillin-Pot Clavulanate] Other (See Comments)    unknown  . Ceclor [Cefaclor] Other (See Comments)    unknown  . Other Other (See Comments)    IBU - s/p bariatric surgery   Family History  Problem Relation Age of Onset  . Diabetes Father   . Depression Father   . Hypertension Father   . Hyperlipidemia Father   . Mental illness Father   . Anxiety disorder Sister   . Depression Maternal Grandfather   . Diabetes Maternal Grandfather   . Hearing loss Maternal Grandfather   . Heart disease Maternal Grandfather   . Kidney disease Maternal Grandfather   . Mental illness Maternal Grandfather   . Depression Maternal Grandmother   . Diabetes Maternal Grandmother   . COPD Paternal Grandfather   . Alcohol abuse Paternal  Grandfather   . Asthma Paternal Grandfather   . Diabetes Paternal Grandfather   . Heart disease Paternal Grandfather   . Hyperlipidemia Paternal Grandfather   . Hypertension Paternal Grandfather   . Bipolar disorder Cousin   . Anxiety disorder Sister   . Hypertension Mother   . Lung cancer Paternal Grandmother   . Depression Paternal Grandmother     Current Outpatient Medications (Endocrine & Metabolic):  .  levonorgestrel (MIRENA) 20 MCG/24HR IUD, 1 each by Intrauterine route once.   Current Outpatient Medications (Respiratory):  .  sodium chloride (OCEAN) 0.65 % SOLN nasal spray, Place 1 spray into both nostrils as needed for congestion.   Current Outpatient Medications (Hematological):  .  cyanocobalamin 1000 MCG tablet, Take 1,000 mcg by mouth daily.  Current Outpatient Medications (Other):  .  Biotin w/ Vitamins C & E (HAIR/SKIN/NAILS PO), Take by mouth. .   Calcium Carbonate (CALCIUM 600 PO), Take by mouth 2 (two) times daily. .  Cholecalciferol (VITAMIN D) 2000 units CAPS, Take by mouth 2 (two) times daily. .  clonazePAM (KLONOPIN) 0.5 MG tablet, Take 1-2 tabs po QHS prn .  Inositol-D Chiro-Inositol (OVASITOL) 2000-50 MG PACK, Take by mouth. Taking for PCOS. .  Omega-3 Fatty Acids (FISH OIL) 1360 MG CAPS, Take by mouth. .  Oxcarbazepine (TRILEPTAL) 300 MG tablet, Take 1.5 tablets (450 mg total) by mouth at bedtime. .  Prenatal Vit-Fe Fumarate-FA (PRENATAL PO), Take by mouth. .  Probiotic Product (PROBIOTIC DAILY PO), Take by mouth. .  Pyridoxine HCl (VITAMIN B6) 200 MG TABS, Take by mouth. .  vitamin C (ASCORBIC ACID) 500 MG tablet, Take 500 mg by mouth daily.    Past medical history, social, surgical and family history all reviewed in electronic medical record.  No pertanent information unless stated regarding to the chief complaint.   Review of Systems:  No headache, visual changes, nausea, vomiting, diarrhea, constipation, dizziness, abdominal pain, skin rash, fevers, chills, night sweats, weight loss, swollen lymph nodes, body aches, joint swelling, muscle aches, chest pain, shortness of breath, mood changes.   Objective  There were no vitals taken for this visit. Systems examined below as of    General: No apparent distress alert and oriented x3 mood and affect normal, dressed appropriately.  HEENT: Pupils equal, extraocular movements intact  Respiratory: Patient's speak in full sentences and does not appear short of breath  Cardiovascular: No lower extremity edema, non tender, no erythema  Skin: Warm dry intact with no signs of infection or rash on extremities or on axial skeleton.  Abdomen: Soft nontender  Neuro: Cranial nerves II through XII are intact, neurovascularly intact in all extremities with 2+ DTRs and 2+ pulses.  Lymph: No lymphadenopathy of posterior or anterior cervical chain or axillae bilaterally.  Gait normal with  good balance and coordination.  MSK:  Non tender with full range of motion and good stability and symmetric strength and tone of shoulders, elbows, wrist, hip, knee and ankles bilaterally.     Impression and Recommendations:     This case required medical decision making of moderate complexity. The above documentation has been reviewed and is accurate and complete Lyndal Pulley, DO       Note: This dictation was prepared with Dragon dictation along with smaller phrase technology. Any transcriptional errors that result from this process are unintentional.

## 2018-11-05 ENCOUNTER — Ambulatory Visit: Payer: Commercial Managed Care - PPO | Admitting: Family Medicine

## 2018-12-20 DIAGNOSIS — Z9884 Bariatric surgery status: Secondary | ICD-10-CM | POA: Insufficient documentation

## 2019-02-07 ENCOUNTER — Other Ambulatory Visit: Payer: Self-pay | Admitting: Psychiatry

## 2019-02-07 DIAGNOSIS — F5105 Insomnia due to other mental disorder: Secondary | ICD-10-CM

## 2019-02-07 DIAGNOSIS — F419 Anxiety disorder, unspecified: Secondary | ICD-10-CM

## 2019-02-12 ENCOUNTER — Other Ambulatory Visit: Payer: Self-pay

## 2019-02-12 ENCOUNTER — Encounter: Payer: Self-pay | Admitting: Psychiatry

## 2019-02-12 ENCOUNTER — Ambulatory Visit (INDEPENDENT_AMBULATORY_CARE_PROVIDER_SITE_OTHER): Payer: Commercial Managed Care - PPO | Admitting: Psychiatry

## 2019-02-12 DIAGNOSIS — F419 Anxiety disorder, unspecified: Secondary | ICD-10-CM

## 2019-02-12 DIAGNOSIS — F5105 Insomnia due to other mental disorder: Secondary | ICD-10-CM

## 2019-02-12 MED ORDER — OXCARBAZEPINE 300 MG PO TABS: 450.0000 mg | ORAL_TABLET | Freq: Every day | ORAL | 1 refills | Status: DC

## 2019-02-12 MED ORDER — CLONAZEPAM 0.5 MG PO TABS: ORAL_TABLET | ORAL | 5 refills | Status: DC

## 2019-02-12 NOTE — Progress Notes (Signed)
[place]LA [place] [id number] Jan 28, 1986 33 y.o.  Subjective:   Patient ID:  Bridget Rich is a 33 y.o. (DOB [birth year]) female.  Chief Complaint:  Chief Complaint  Patient presents with  . Follow-up    h/o Anxiety, Depression, and Insomnia    HPI Bridget Rich presents to the office today for follow-up of mood and anxiety.  Has noticed some increased depression and reports that it is mostly situational. Reports that she has also had occasional anxiety. Reports that she is crying more easily. Reports that she has had occasional middle of the night awakenings and this resolved. Reports sleeping at least 6.5 hours of sleep. Has had a few nightmares. Some intrusive memories. Appetite has been fine. Energy has been low. Motivation has been low at times and called out of work yesterday. Denies any other call outs for work. Concentration has been ok. Vague passive death wishes. Denies SI.   Postponed wedding to next year due to covid. Reports that they went to couples counseling. Reports that she has resumed therapy with past therapist she had seen in [place] and feels that she has made more progress.   Has had episodes of binge drinking and she and therapist have discussed that she has been using ETOH to numb uncomfortable feelings and in response to past experiences. 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 She reports that impulsive behaviors typically occur when she is depressed. Denies elevated mood. She reports that she spent more on deposits for her wedding. She reports that she has created a budget.   Reports that she stopped all alcohol use a couple of weeks ago.  Review of Systems:  Review of Systems  Gastrointestinal: Negative.   Musculoskeletal: Negative for gait problem.  Neurological: Negative for tremors and headaches.  Psychiatric/Behavioral:       Please refer to HPI    Medications: I have  reviewed the patient's current medications.  Current Outpatient Medications  Medication Sig Dispense Refill  . Biotin w/ Vitamins C & E (HAIR/SKIN/NAILS PO) Take by mouth.    . Calcium Carbonate (CALCIUM 600 PO) Take by mouth 2 (two) times daily.    . Cholecalciferol (VITAMIN D) 2000 units CAPS Take by mouth 2 (two) times daily.    Derrill Memo ON 02/18/2019] clonazePAM (KLONOPIN) 0.5 MG tablet Take 1-2 tabs po QHS prn 60 tablet 5  . cyanocobalamin 1000 MCG tablet Take 1,000 mcg by mouth daily.    Blanchie Dessert Chiro-Inositol (OVASITOL) 2000-50 MG PACK Take by mouth. Taking for PCOS.    Marland Kitchen levonorgestrel (MIRENA) 20 MCG/24HR IUD 1 each by Intrauterine route once.    . Omega-3 Fatty Acids (FISH OIL) 1360 MG CAPS Take by mouth.    . Oxcarbazepine (TRILEPTAL) 300 MG tablet Take 1.5 tablets (450 mg total) by mouth at bedtime. 135 tablet 1  . Prenatal Vit-Fe Fumarate-FA (PRENATAL PO) Take by mouth.    . Probiotic Product (PROBIOTIC DAILY PO) Take by mouth.    . Pyridoxine HCl (VITAMIN B6) 200 MG TABS Take by mouth.    . metroNIDAZOLE (FLAGYL) 500 MG tablet Flagyl 500 mg tablet  Take 1 tablet twice a day by oral route for 7 days.    . vitamin C (ASCORBIC ACID) 500 MG tablet Take 500 mg by mouth daily.     No current facility-administered medications for this visit.     Medication Side Effects: None  Allergies:  Allergies  Allergen  Reactions  . Augmentin [Amoxicillin-Pot Clavulanate] Other (See Comments)    unknown  . Ceclor [Cefaclor] Other (See Comments)    unknown  . Other Other (See Comments)    IBU - s/p bariatric surgery    Past Medical History:  Diagnosis Date  . Chicken pox   . Depression with anxiety   . GERD (gastroesophageal reflux disease)   . IUD (intrauterine device) in place   . PCOS (polycystic ovarian syndrome)     Family History  Problem Relation Age of Onset  . Diabetes Father   . Depression Father   . Hypertension Father   . Hyperlipidemia Father   . Mental  illness Father   . Anxiety disorder Sister   . Depression Maternal Grandfather   . Diabetes Maternal Grandfather   . Hearing loss Maternal Grandfather   . Heart disease Maternal Grandfather   . Kidney disease Maternal Grandfather   . Mental illness Maternal Grandfather   . Depression Maternal Grandmother   . Diabetes Maternal Grandmother   . COPD Paternal Grandfather   . Alcohol abuse Paternal Grandfather   . Asthma Paternal Grandfather   . Diabetes Paternal Grandfather   . Heart disease Paternal Grandfather   . Hyperlipidemia Paternal Grandfather   . Hypertension Paternal Grandfather   . Bipolar disorder Cousin   . Anxiety disorder Sister   . Hypertension Mother   . Lung cancer Paternal Grandmother   . Depression Paternal Grandmother     Social History   Socioeconomic History  . Marital status: Single    Spouse name: Not on file  . Number of children: Not on file  . Years of education: Not on file  . Highest education level: Not on file  Occupational History  . Not on file  Social Needs  . Financial resource strain: Not on file  . Food insecurity    Worry: Not on file    Inability: Not on file  . Transportation needs    Medical: Not on file    Non-medical: Not on file  Tobacco Use  . Smoking status: Never Smoker  . Smokeless tobacco: Never Used  Substance and Sexual Activity  . Alcohol use: Yes    Comment: Occasionally 6-10 over a month   . Drug use: No  . Sexual activity: Yes    Partners: Male    Comment: iud  Lifestyle  . Physical activity    Days per week: Not on file    Minutes per session: Not on file  . Stress: Not on file  Relationships  . Social Herbalist on phone: Not on file    Gets together: Not on file    Attends religious service: Not on file    Active member of club or organization: Not on file    Attends meetings of clubs or organizations: Not on file    Relationship status: Not on file  . Intimate partner violence    Fear  of current or ex partner: Not on file    Emotionally abused: Not on file    Physically abused: Not on file    Forced sexual activity: Not on file  Other Topics Concern  . Not on file  Social History Narrative   Marital status/children/pets: single   Education/employment: Master's degree, Psychologist, clinical.    Safety:      -Wears a bicycle helmet riding a bike: Yes     -smoke alarm in the home:Yes     -  wears seatbelt: Yes     - Feels safe in their relationships: Yes    Past Medical History, Surgical history, Social history, and Family history were reviewed and updated as appropriate.   Please see review of systems for further details on the patient's review from today.   Objective:   Physical Exam:  There were no vitals taken for this visit.  Physical Exam Constitutional:      General: She is not in acute distress.    Appearance: She is well-developed.  Musculoskeletal:        General: No deformity.  Neurological:     Mental Status: She is alert and oriented to person, place, and time.     Coordination: Coordination normal.  Psychiatric:        Attention and Perception: Attention and perception normal. She does not perceive auditory or visual hallucinations.        Mood and Affect: Affect is not labile, blunt, angry or inappropriate.        Speech: Speech normal.        Behavior: Behavior normal.        Thought Content: Thought content normal. Thought content does not include homicidal or suicidal ideation. Thought content does not include homicidal or suicidal plan.        Cognition and Memory: Cognition and memory normal.        Judgment: Judgment normal.     Comments: Mood is appropriate to content.  Affect is congruent. Insight intact. No delusions.      Lab Review:     Component Value Date/Time   NA 136 (A) 01/25/2017   K 4.1 01/25/2017   CL 102 11/20/2014 0243   CO2 25 11/20/2014 0041   GLUCOSE 103 (H) 11/20/2014 0243   BUN 16 11/20/2014 0243    CREATININE 0.6 01/25/2017   CREATININE 0.80 11/20/2014 0243   CALCIUM 9.3 11/20/2014 0041   AST 23 01/25/2017   ALT 24 01/25/2017   GFRNONAA >60 11/20/2014 0041   GFRAA >60 11/20/2014 0041       Component Value Date/Time   WBC 8.7 11/20/2014 0041   RBC 4.67 11/20/2014 0041   HGB 13.6 11/20/2014 0243   HCT 40.0 11/20/2014 0243   PLT 303 11/20/2014 0041   MCV 84.8 11/20/2014 0041   MCH 28.5 11/20/2014 0041   MCHC 33.6 11/20/2014 0041   RDW 13.4 11/20/2014 0041    No results found for: POCLITH, LITHIUM   No results found for: PHENYTOIN, PHENOBARB, VALPROATE, CBMZ   .res Assessment: Plan:   Patient seen for 30 minutes and greater than 50% of visit spent counseling patient and coordination of care to include discussing various resources and continued need for therapy. Patient reports that she would like to continue current medications without any changes.  She reports that some of her recent mood and anxiety signs and symptoms are likely in response to processing things from the past and making progress in therapy.  She reports that overall mood and anxiety signs and symptoms have been manageable.  Discussed signs and symptoms that may indicate need for change in medication treatment. Continue Trileptal 450 mg at bedtime for anxiety and insomnia. Continue Klonopin 0.5 mg 1-2 tabs at at bedtime as needed for insomnia. Patient to follow-up in 6 months or sooner if clinically indicated. Patient advised to contact office with any questions, adverse effects, or acute worsening in signs and symptoms.  Maneh was seen today for follow-up.  Diagnoses and all orders for  this visit:  Anxiety disorder, unspecified type -     Oxcarbazepine (TRILEPTAL) 300 MG tablet; Take 1.5 tablets (450 mg total) by mouth at bedtime. -     clonazePAM (KLONOPIN) 0.5 MG tablet; Take 1-2 tabs po QHS prn  Insomnia secondary to anxiety -     Oxcarbazepine (TRILEPTAL) 300 MG tablet; Take 1.5 tablets (450 mg  total) by mouth at bedtime. -     clonazePAM (KLONOPIN) 0.5 MG tablet; Take 1-2 tabs po QHS prn     Please see After Visit Summary for patient specific instructions.  Future Appointments  Date Time Provider Martin  08/09/2019  8:30 AM Thayer Headings, PMHNP CP-CP None    No orders of the defined types were placed in this encounter.   -------------------------------

## 2019-03-08 ENCOUNTER — Telehealth: Payer: Self-pay | Admitting: Family Medicine

## 2019-03-08 NOTE — Telephone Encounter (Signed)
Patient requesting to get TB test for employment purposes.  Please advise.  Patient can be reached at [phone]

## 2019-03-08 NOTE — Telephone Encounter (Signed)
Pts last CPE was 05/08/2018. Please advise if patient may come for nurse visit to complete.

## 2019-03-11 ENCOUNTER — Telehealth: Payer: Self-pay | Admitting: Family Medicine

## 2019-03-11 DIAGNOSIS — M419 Scoliosis, unspecified: Secondary | ICD-10-CM | POA: Insufficient documentation

## 2019-03-11 DIAGNOSIS — K9049 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, not elsewhere classified: Secondary | ICD-10-CM | POA: Insufficient documentation

## 2019-03-11 NOTE — Telephone Encounter (Signed)
She can have a PPD placed and read 48-72 hrs later by nurse.

## 2019-03-11 NOTE — Telephone Encounter (Signed)
Pt was called and scheduled !

## 2019-03-11 NOTE — Telephone Encounter (Signed)
2nd request (I was not able to add to 1st telephone note) Patient needs TB skin test to start new job, would like to get the test here with her PCP office but needs it ASAP.  Please call [phone]  Thank you

## 2019-03-12 ENCOUNTER — Other Ambulatory Visit: Payer: Self-pay

## 2019-03-12 ENCOUNTER — Ambulatory Visit (INDEPENDENT_AMBULATORY_CARE_PROVIDER_SITE_OTHER): Payer: Commercial Managed Care - PPO | Admitting: Family Medicine

## 2019-03-12 DIAGNOSIS — Z111 Encounter for screening for respiratory tuberculosis: Secondary | ICD-10-CM | POA: Diagnosis not present

## 2019-03-14 ENCOUNTER — Ambulatory Visit: Payer: Commercial Managed Care - PPO

## 2019-03-14 LAB — TB SKIN TEST
Induration: 0 mm
TB Skin Test: NEGATIVE

## 2019-03-22 ENCOUNTER — Other Ambulatory Visit: Payer: Self-pay

## 2019-03-25 ENCOUNTER — Ambulatory Visit (INDEPENDENT_AMBULATORY_CARE_PROVIDER_SITE_OTHER): Payer: Commercial Managed Care - PPO | Admitting: Family Medicine

## 2019-03-25 ENCOUNTER — Other Ambulatory Visit: Payer: Self-pay

## 2019-03-25 DIAGNOSIS — Z111 Encounter for screening for respiratory tuberculosis: Secondary | ICD-10-CM | POA: Diagnosis not present

## 2019-03-25 NOTE — Progress Notes (Signed)
Patient presented for second injection of PPD for two step testing.  Patient scheduled to return 03/27/2019 after 9:15am.

## 2019-03-27 ENCOUNTER — Ambulatory Visit: Payer: Commercial Managed Care - PPO

## 2019-03-27 LAB — TB SKIN TEST
Induration: 0 mm
TB Skin Test: NEGATIVE

## 2019-03-27 NOTE — Progress Notes (Signed)
Paperwork completed for patient for work with TB test results. Copy was made and sent to scan.

## 2019-04-24 ENCOUNTER — Ambulatory Visit: Payer: Commercial Managed Care - PPO | Admitting: Family Medicine

## 2019-05-02 ENCOUNTER — Ambulatory Visit: Payer: Commercial Managed Care - PPO | Admitting: Family Medicine

## 2019-05-02 ENCOUNTER — Encounter: Payer: Self-pay | Admitting: Family Medicine

## 2019-05-02 ENCOUNTER — Other Ambulatory Visit: Payer: Self-pay

## 2019-05-02 DIAGNOSIS — M999 Biomechanical lesion, unspecified: Secondary | ICD-10-CM

## 2019-05-02 DIAGNOSIS — M545 Low back pain, unspecified: Secondary | ICD-10-CM | POA: Insufficient documentation

## 2019-05-02 DIAGNOSIS — G8929 Other chronic pain: Secondary | ICD-10-CM | POA: Diagnosis not present

## 2019-05-02 NOTE — Assessment & Plan Note (Signed)
Decision today to treat with OMT was based on Physical Exam  After verbal consent patient was treated with HVLA, ME, FPR techniques in cervical, thoracic, rib,  lumbar and sacral areas  Patient tolerated the procedure well with improvement in symptoms  Patient given exercises, stretches and lifestyle modifications  See medications in patient instructions if given  Patient will follow up in 4-8 weeks 

## 2019-05-02 NOTE — Progress Notes (Signed)
[place] [address] [place] Phone: [phone] Subjective:   I Bridget Rich am serving as a Education administrator for Dr. Hulan Saas.  This visit occurred during the SARS-CoV-2 public health emergency.  Safety protocols were in place, including screening questions prior to the visit, additional usage of staff PPE, and extensive cleaning of exam room while observing appropriate contact time as indicated for disinfecting solutions.   I'm seeing this patient by the request  of:  Kuneff, Renee A, DO  CC: Low back pain  [id number]  Bridget Rich is a 34 y.o. female coming in with complaint of low back pain. Saw Dr. Paulla Fore in 2019. Was told she has mild scoliosis. Was given stretches. Stated back going to the gym and noticed that with extension of the knee her hip pops. Sacrum pain as well. Ring finger on left hand is having issues as well. 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 Started a new job recently and she has been on her feet more. States she isn't in much pain at all.   Onset- Chronic  Location - left side  Duration-  Character- tense and tight  Aggravating factors-  Reliving factors-  Therapies tried- stretrching, tylenol  Severity-4 out of 10     Past Medical History:  Diagnosis Date  . Chicken pox   . Depression with anxiety   . GERD (gastroesophageal reflux disease)   . IUD (intrauterine device) in place   . PCOS (polycystic ovarian syndrome)    Past Surgical History:  Procedure Laterality Date  . BARIATRIC SURGERY  12/2016  . MOUTH SURGERY  2016   Social History   Socioeconomic History  . Marital status: Single    Spouse name: Not on file  . Number of children: Not on file  . Years of education: Not on file  . Highest education level: Not on file  Occupational History  . Not on file  Tobacco Use  . Smoking status: Never Smoker  . Smokeless tobacco: Never Used   Substance and Sexual Activity  . Alcohol use: Yes    Comment: Occasionally 6-10 over a month   . Drug use: No  . Sexual activity: Yes    Partners: Male    Comment: iud  Other Topics Concern  . Not on file  Social History Narrative   Marital status/children/pets: single   Education/employment: Master's degree, Psychologist, clinical.    Safety:      -Wears a bicycle helmet riding a bike: Yes     -smoke alarm in the home:Yes     - wears seatbelt: Yes     - Feels safe in their relationships: Yes   Social Determinants of Health   Financial Resource Strain:   . Difficulty of Paying Living Expenses: Not on file  Food Insecurity:   . Worried About Charity fundraiser in the Last Year: Not on file  . Ran Out of Food in the Last Year: Not on file  Transportation Needs:   . Lack of Transportation (Medical): Not on file  . Lack of Transportation (Non-Medical): Not on file  Physical Activity:   . Days of Exercise per Week: Not on file  . Minutes of Exercise per Session: Not on file  Stress:   . Feeling of Stress : Not on file  Social Connections:   . Frequency of Communication with Friends and Family:  Not on file  . Frequency of Social Gatherings with Friends and Family: Not on file  . Attends Religious Services: Not on file  . Active Member of Clubs or Organizations: Not on file  . Attends Archivist Meetings: Not on file  . Marital Status: Not on file   Allergies  Allergen Reactions  . Augmentin [Amoxicillin-Pot Clavulanate] Other (See Comments)    unknown  . Ceclor [Cefaclor] Other (See Comments)    unknown  . Other Other (See Comments)    IBU - s/p bariatric surgery   Family History  Problem Relation Age of Onset  . Diabetes Father   . Depression Father   . Hypertension Father   . Hyperlipidemia Father   . Mental illness Father   . Anxiety disorder Sister   . Depression Maternal Grandfather   . Diabetes Maternal Grandfather   . Hearing loss  Maternal Grandfather   . Heart disease Maternal Grandfather   . Kidney disease Maternal Grandfather   . Mental illness Maternal Grandfather   . Depression Maternal Grandmother   . Diabetes Maternal Grandmother   . COPD Paternal Grandfather   . Alcohol abuse Paternal Grandfather   . Asthma Paternal Grandfather   . Diabetes Paternal Grandfather   . Heart disease Paternal Grandfather   . Hyperlipidemia Paternal Grandfather   . Hypertension Paternal Grandfather   . Bipolar disorder Cousin   . Anxiety disorder Sister   . Hypertension Mother   . Lung cancer Paternal Grandmother   . Depression Paternal Grandmother     Current Outpatient Medications (Endocrine & Metabolic):  .  levonorgestrel (MIRENA) 20 MCG/24HR IUD, 1 each by Intrauterine route once.     Current Outpatient Medications (Hematological):  .  cyanocobalamin 1000 MCG tablet, Take 1,000 mcg by mouth daily.  Current Outpatient Medications (Other):  .  Biotin w/ Vitamins C & E (HAIR/SKIN/NAILS PO), Take by mouth. .  Calcium Carbonate (CALCIUM 600 PO), Take by mouth 2 (two) times daily. .  Cholecalciferol (VITAMIN D) 2000 units CAPS, Take by mouth 2 (two) times daily. .  clonazePAM (KLONOPIN) 0.5 MG tablet, Take 1-2 tabs po QHS prn .  Inositol-D Chiro-Inositol (OVASITOL) 2000-50 MG PACK, Take by mouth. Taking for PCOS. .  Omega-3 Fatty Acids (FISH OIL) 1360 MG CAPS, Take by mouth. .  Oxcarbazepine (TRILEPTAL) 300 MG tablet, Take 1.5 tablets (450 mg total) by mouth at bedtime. .  Prenatal Vit-Fe Fumarate-FA (PRENATAL PO), Take by mouth. .  Probiotic Product (PROBIOTIC DAILY PO), Take by mouth. .  Pyridoxine HCl (VITAMIN B6) 200 MG TABS, Take by mouth. .  vitamin C (ASCORBIC ACID) 500 MG tablet, Take 500 mg by mouth daily.   Reviewed prior external information including notes and imaging from  primary care provider As well as notes that were available from care everywhere and other healthcare systems.  Past medical  history, social, surgical and family history all reviewed in electronic medical record.  No pertanent information unless stated regarding to the chief complaint.   Review of Systems:  No headache, visual changes, nausea, vomiting, diarrhea, constipation, dizziness, abdominal pain, skin rash, fevers, chills, night sweats, weight loss, swollen lymph nodes, body aches, joint swelling, chest pain, shortness of breath, mood changes. POSITIVE muscle aches  Objective  Blood pressure 110/80, pulse 74, height 5' 8.5" (1.74 m), weight 234 lb (106.1 kg), SpO2 98 %.   General: No apparent distress alert and oriented x3 mood and affect normal, dressed appropriately.  HEENT: Pupils equal, extraocular  movements intact  Respiratory: Patient's speak in full sentences and does not appear short of breath  Cardiovascular: No lower extremity edema, non tender, no erythema  Skin: Warm dry intact with no signs of infection or rash on extremities or on axial skeleton.  Abdomen: Soft nontender  Neuro: Cranial nerves II through XII are intact, neurovascularly intact in all extremities with 2+ DTRs and 2+ pulses.  Lymph: No lymphadenopathy of posterior or anterior cervical chain or axillae bilaterally.  Gait normal with good balance and coordination.  MSK:  tender with full range of motion and good stability and symmetric strength and tone of shoulders, elbows, wrist, hip, knee and ankles bilaterally.  Low back exam does have some loss of lordosis.  Patient does have some tightness in the paraspinal musculature mostly at the thoracolumbar junction right greater than left.  Negative straight leg test but significant tightness in the hamstrings.  5 out of 5 strength in lower extremities  Osteopathic findings C2 flexed rotated and side bent right C7 flexed rotated and side bent left T3 extended rotated and side bent right inhaled third rib T6 extended rotated and side bent left L2 flexed rotated and side bent  right Sacrum right on right    Impression and Recommendations:     This case required medical decision making of moderate complexity. The above documentation has been reviewed and is accurate and complete Lyndal Pulley, DO       Note: This dictation was prepared with Dragon dictation along with smaller phrase technology. Any transcriptional errors that result from this process are unintentional.

## 2019-05-02 NOTE — Assessment & Plan Note (Signed)
Low back pain.  Discussed the importance of core stability.  We discussed strengthening exercises, icing regimen, which activities to do which wants to avoid.  Patient is to increase activity as tolerated.  Mild tightness noted with Corky Sox.  Spondee very well to osteopathic manipulation.

## 2019-05-02 NOTE — Patient Instructions (Addendum)
Spenco orthotics "total support"  Exercise 3 times a week See me again in 5-6 weeks

## 2019-06-10 ENCOUNTER — Ambulatory Visit: Payer: Commercial Managed Care - PPO | Admitting: Family Medicine

## 2019-08-09 ENCOUNTER — Other Ambulatory Visit: Payer: Self-pay

## 2019-08-09 ENCOUNTER — Ambulatory Visit (INDEPENDENT_AMBULATORY_CARE_PROVIDER_SITE_OTHER): Payer: Commercial Managed Care - PPO | Admitting: Psychiatry

## 2019-08-09 ENCOUNTER — Encounter: Payer: Self-pay | Admitting: Psychiatry

## 2019-08-09 DIAGNOSIS — F5105 Insomnia due to other mental disorder: Secondary | ICD-10-CM

## 2019-08-09 DIAGNOSIS — F419 Anxiety disorder, unspecified: Secondary | ICD-10-CM

## 2019-08-09 DIAGNOSIS — F39 Unspecified mood [affective] disorder: Secondary | ICD-10-CM

## 2019-08-09 MED ORDER — CLONAZEPAM 0.5 MG PO TABS: ORAL_TABLET | ORAL | 5 refills | Status: DC

## 2019-08-09 MED ORDER — LURASIDONE HCL 40 MG PO TABS: 40.0000 mg | ORAL_TABLET | Freq: Every day | ORAL | 0 refills | Status: DC

## 2019-08-09 MED ORDER — OXCARBAZEPINE 300 MG PO TABS: 450.0000 mg | ORAL_TABLET | Freq: Every day | ORAL | 1 refills | Status: DC

## 2019-08-09 MED ORDER — LATUDA 20 MG PO TABS: 20.0000 mg | ORAL_TABLET | Freq: Every day | ORAL | 0 refills | Status: DC

## 2019-08-09 NOTE — Progress Notes (Signed)
[facility] [id number] 1986-01-08 34 y.o.  Subjective:   Patient ID:  Bridget Rich is a 34 y.o. (DOB 1986/02/15) female.  Chief Complaint:  Chief Complaint  Patient presents with  . Anxiety  . Depression    HPI Chrishelle M Rich presents to the office today for follow-up of anxiety and depression. She reports, "I'm really stressed out." She reports that she has felt more depressed. She reports that her motivation has been low and has had difficulty getting out of bed and taking care of her hygiene. Low energy. Has had increased anxiety. Mood has been irritable. Difficulty falling asleep. Awakens several times a night. She has been having difficulty waking up and getting up. Has been binge eating more and eating foods she normally would not eat. Drank more ETOH at a friend's house about 3 weekends ago. Has not had ETOH since then. Has had difficulty with concentration. Has had increased difficulty managing when different situations arise.Has been impulsively shopping and eating. Denies SI.   Now working in home health. Going to [employer] today for [employer] graduation from grad school.  Left previous job in [place]. 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 Insurance starts June 1st. Also works prn for [employer] and is working some weekends.  Getting married in October. Reports that she had an issue with their venue and had to find another venue.   Past Psychiatric Medication Trials: Trileptal Prozac- Tolerated at 10 mg po qd. More anxious at 20 mg. Took for awhile. wellbutrin Sr-adverse reaction Rexulti Abilify- helped initially and then had concentration issues. Klonopin  GAD-7     Office Visit from 05/08/2018 in [place]  Total GAD-7 Score  0    PHQ2-9     Office Visit from 05/08/2018 in [place]  PHQ-2 Total Score  1  PHQ-9 Total Score  1       Review of Systems:  Review of Systems  Musculoskeletal: Negative  for gait problem.  Neurological: Negative for tremors.  Psychiatric/Behavioral:       Please refer to HPI    Medications: I have reviewed the patient's current medications.  Current Outpatient Medications  Medication Sig Dispense Refill  . Biotin w/ Vitamins C & E (HAIR/SKIN/NAILS PO) Take by mouth.    . Calcium Carbonate (CALCIUM 600 PO) Take by mouth 2 (two) times daily.    . Cholecalciferol (VITAMIN D) 2000 units CAPS Take by mouth 2 (two) times daily.    Derrill Memo ON 08/21/2019] clonazePAM (KLONOPIN) 0.5 MG tablet Take 1-2 tabs po QHS prn 60 tablet 5  . cyanocobalamin 1000 MCG tablet Take 1,000 mcg by mouth daily.    Blanchie Dessert Chiro-Inositol (OVASITOL) 2000-50 MG PACK Take by mouth. Taking for PCOS.    Marland Kitchen levonorgestrel (MIRENA) 20 MCG/24HR IUD 1 each by Intrauterine route once.    . Omega-3 Fatty Acids (FISH OIL) 1360 MG CAPS Take by mouth.    . Oxcarbazepine (TRILEPTAL) 300 MG tablet Take 1.5 tablets (450 mg total) by mouth at bedtime. 135 tablet 1  . Prenatal Vit-Fe Fumarate-FA (PRENATAL PO) Take by mouth.    . Pyridoxine HCl (VITAMIN B6) 200 MG TABS Take by mouth.    . vitamin C (ASCORBIC ACID) 500 MG tablet Take 500 mg by mouth daily.    Marland Kitchen lurasidone (LATUDA) 20 MG TABS tablet Take 1 tablet (20 mg total) by mouth daily with supper for 7 days. 7 tablet 0  .  lurasidone (LATUDA) 40 MG TABS tablet Take 1 tablet (40 mg total) by mouth daily with breakfast. 30 tablet 0   No current facility-administered medications for this visit.    Medication Side Effects: None  Allergies:  Allergies  Allergen Reactions  . Augmentin [Amoxicillin-Pot Clavulanate] Other (See Comments)    unknown  . Ceclor [Cefaclor] Other (See Comments)    unknown  . Other Other (See Comments)    IBU - s/p bariatric surgery    Past Medical History:  Diagnosis Date  . Chicken pox   . Depression with anxiety   . GERD (gastroesophageal reflux disease)   . IUD (intrauterine device) in place   . PCOS  (polycystic ovarian syndrome)     Family History  Problem Relation Age of Onset  . Diabetes Father   . Depression Father   . Hypertension Father   . Hyperlipidemia Father   . Mental illness Father   . Anxiety disorder Sister   . Depression Maternal Grandfather   . Diabetes Maternal Grandfather   . Hearing loss Maternal Grandfather   . Heart disease Maternal Grandfather   . Kidney disease Maternal Grandfather   . Mental illness Maternal Grandfather   . Depression Maternal Grandmother   . Diabetes Maternal Grandmother   . COPD Paternal Grandfather   . Alcohol abuse Paternal Grandfather   . Asthma Paternal Grandfather   . Diabetes Paternal Grandfather   . Heart disease Paternal Grandfather   . Hyperlipidemia Paternal Grandfather   . Hypertension Paternal Grandfather   . Bipolar disorder Cousin   . Anxiety disorder Sister   . Hypertension Mother   . Lung cancer Paternal Grandmother   . Depression Paternal Grandmother     Social History   Socioeconomic History  . Marital status: Single    Spouse name: Not on file  . Number of children: Not on file  . Years of education: Not on file  . Highest education level: Not on file  Occupational History  . Not on file  Tobacco Use  . Smoking status: Never Smoker  . Smokeless tobacco: Never Used  Substance and Sexual Activity  . Alcohol use: Yes    Comment: Occasionally 6-10 over a month   . Drug use: No  . Sexual activity: Yes    Partners: Male    Comment: iud  Other Topics Concern  . Not on file  Social History Narrative   Marital status/children/pets: single   Education/employment: Master's degree, Psychologist, clinical.    Safety:      -Wears a bicycle helmet riding a bike: Yes     -smoke alarm in the home:Yes     - wears seatbelt: Yes     - Feels safe in their relationships: Yes   Social Determinants of Health   Financial Resource Strain:   . Difficulty of Paying Living Expenses:   Food Insecurity:   .  Worried About Charity fundraiser in the Last Year:   . Arboriculturist in the Last Year:   Transportation Needs:   . Film/video editor (Medical):   Marland Kitchen Lack of Transportation (Non-Medical):   Physical Activity:   . Days of Exercise per Week:   . Minutes of Exercise per Session:   Stress:   . Feeling of Stress :   Social Connections:   . Frequency of Communication with Friends and Family:   . Frequency of Social Gatherings with Friends and Family:   . Attends Religious Services:   .  Active Member of Clubs or Organizations:   . Attends Archivist Meetings:   Marland Kitchen Marital Status:   Intimate Partner Violence:   . Fear of Current or Ex-Partner:   . Emotionally Abused:   Marland Kitchen Physically Abused:   . Sexually Abused:     Past Medical History, Surgical history, Social history, and Family history were reviewed and updated as appropriate.   Please see review of systems for further details on the patient's review from today.   Objective:   Physical Exam:  There were no vitals taken for this visit.  Physical Exam Constitutional:      General: She is not in acute distress. Musculoskeletal:        General: No deformity.  Neurological:     Mental Status: She is alert and oriented to person, place, and time.     Coordination: Coordination normal.  Psychiatric:        Attention and Perception: Attention and perception normal. She does not perceive auditory or visual hallucinations.        Mood and Affect: Mood is anxious and depressed. Affect is not labile, blunt, angry or inappropriate.        Speech: Speech normal.        Behavior: Behavior normal.        Thought Content: Thought content normal. Thought content is not paranoid or delusional. Thought content does not include homicidal or suicidal ideation. Thought content does not include homicidal or suicidal plan.        Cognition and Memory: Cognition and memory normal.        Judgment: Judgment normal.     Comments:  Insight intact Dysphoric mood     Lab Review:     Component Value Date/Time   NA 136 (A) 01/25/2017 0000   K 4.1 01/25/2017 0000   CL 102 11/20/2014 0243   CO2 25 11/20/2014 0041   GLUCOSE 103 (H) 11/20/2014 0243   BUN 16 11/20/2014 0243   CREATININE 0.6 01/25/2017 0000   CREATININE 0.80 11/20/2014 0243   CALCIUM 9.3 11/20/2014 0041   AST 23 01/25/2017 0000   ALT 24 01/25/2017 0000   GFRNONAA >60 11/20/2014 0041   GFRAA >60 11/20/2014 0041       Component Value Date/Time   WBC 8.7 11/20/2014 0041   RBC 4.67 11/20/2014 0041   HGB 13.6 11/20/2014 0243   HCT 40.0 11/20/2014 0243   PLT 303 11/20/2014 0041   MCV 84.8 11/20/2014 0041   MCH 28.5 11/20/2014 0041   MCHC 33.6 11/20/2014 0041   RDW 13.4 11/20/2014 0041    No results found for: POCLITH, LITHIUM   No results found for: PHENYTOIN, PHENOBARB, VALPROATE, CBMZ   .res Assessment: Plan:   Discussed several tx options to include re-starting Prozac since this may have been helpful for her mood and anxiety s/s in the past and been well tolerated; or potential benefits, risks, and side effects of Latuda. Discussed potential metabolic side effects associated with atypical antipsychotics, as well as potential risk for movement side effects. Advised pt to contact office if movement side effects occur.  Discussed that [place] may help with depressive s/s quicker than some other tx options and may also be helpful for anxiety and irritability. Pt agrees to trial of Latuda. Will start Latuda 20 mg po qd with evening meal for one week, then increase to 40 mg po qd with evening meal to improve mood s/s.  Continue Trileptal for mood and anxiety s/s.  Continue Klonopin for anxiety.  Pt to f/u in 4 weeks or sooner if clinically indicated.  Patient advised to contact office with any questions, adverse effects, or acute worsening in signs and symptoms.  Kyleena was seen today for anxiety and depression.  Diagnoses and all orders for  this visit:  Episodic mood disorder (HCC) -     lurasidone (LATUDA) 20 MG TABS tablet; Take 1 tablet (20 mg total) by mouth daily with supper for 7 days. -     lurasidone (LATUDA) 40 MG TABS tablet; Take 1 tablet (40 mg total) by mouth daily with breakfast.  Anxiety disorder, unspecified type -     clonazePAM (KLONOPIN) 0.5 MG tablet; Take 1-2 tabs po QHS prn -     Oxcarbazepine (TRILEPTAL) 300 MG tablet; Take 1.5 tablets (450 mg total) by mouth at bedtime.  Insomnia secondary to anxiety -     clonazePAM (KLONOPIN) 0.5 MG tablet; Take 1-2 tabs po QHS prn -     Oxcarbazepine (TRILEPTAL) 300 MG tablet; Take 1.5 tablets (450 mg total) by mouth at bedtime.     Please see After Visit Summary for patient specific instructions.  Future Appointments  Date Time Provider [place]  09/06/2019 11:30 AM Thayer Headings, PMHNP [facility] None    No orders of the defined types were placed in this encounter.   -------------------------------

## 2019-09-06 ENCOUNTER — Ambulatory Visit: Payer: Self-pay | Admitting: Psychiatry

## 2020-01-09 ENCOUNTER — Telehealth: Payer: Self-pay | Admitting: Psychiatry

## 2020-01-09 NOTE — Telephone Encounter (Signed)
Pt called and said that she is going on her honeymoon when her refill of klonopin is due. So she was wondering if [facility] [address] [facility] [place].

## 2020-01-10 NOTE — Telephone Encounter (Signed)
Bridget Rich called this morning again about her Klonopin refill.  She doesn't need a prescription sent in she just needs approval for early refill.  It is not due for refill until around the 12th, but she is leaving on her honeymoon on the 11th so needs to be able to pick it up by the 10th to take with her on her trip.  Pharmacy told her all they need is approval from the doctor to go ahead and fill it.  Please call the pharmacy to approve early refill.  She has changed the pharmacy.  [facility] [address]  You will need to call that pharmacy.

## 2020-01-10 NOTE — Telephone Encounter (Signed)
Called [facility] [address] [phone] and they said her last refill was 12/17/2019 and said it would be a week early to fill today. Informed them I would let Janett Billow know since it was so early.

## 2020-01-10 NOTE — Telephone Encounter (Signed)
Contacted pharmacist, she was a different one then earlier. She is concerned that the refill dates keep moving up farther and farther. [facility] policy doesn't allow more then 2 days from pick up from last month. Wondering if wherever she's going a new Rx can be sent. She listed dates 09/14, 08/14, 7/17, 6/18, 5/19, 4/21, 3/19, 2/18, 1/17   You will have to contact them to okay an early refill, I have tried twice and they are not comfortable with it.

## 2020-01-10 NOTE — Telephone Encounter (Signed)
Okay for early refill.

## 2020-02-15 ENCOUNTER — Other Ambulatory Visit: Payer: Self-pay | Admitting: Psychiatry

## 2020-02-15 DIAGNOSIS — F419 Anxiety disorder, unspecified: Secondary | ICD-10-CM

## 2020-02-17 NOTE — Telephone Encounter (Signed)
Last apt 08/09/19 due back 4 weeks

## 2020-03-17 ENCOUNTER — Other Ambulatory Visit: Payer: Self-pay | Admitting: Psychiatry

## 2020-03-17 DIAGNOSIS — F419 Anxiety disorder, unspecified: Secondary | ICD-10-CM

## 2020-04-20 ENCOUNTER — Other Ambulatory Visit: Payer: Self-pay | Admitting: Psychiatry

## 2020-04-20 DIAGNOSIS — F419 Anxiety disorder, unspecified: Secondary | ICD-10-CM

## 2020-04-20 DIAGNOSIS — F5105 Insomnia due to other mental disorder: Secondary | ICD-10-CM

## 2020-04-21 NOTE — Telephone Encounter (Signed)
Scheduled 02/07

## 2020-05-11 ENCOUNTER — Encounter: Payer: Self-pay | Admitting: Psychiatry

## 2020-05-11 ENCOUNTER — Other Ambulatory Visit: Payer: Self-pay

## 2020-05-11 ENCOUNTER — Ambulatory Visit (INDEPENDENT_AMBULATORY_CARE_PROVIDER_SITE_OTHER): Payer: 59 | Admitting: Psychiatry

## 2020-05-11 DIAGNOSIS — F419 Anxiety disorder, unspecified: Secondary | ICD-10-CM

## 2020-05-11 DIAGNOSIS — F39 Unspecified mood [affective] disorder: Secondary | ICD-10-CM | POA: Diagnosis not present

## 2020-05-11 DIAGNOSIS — F5105 Insomnia due to other mental disorder: Secondary | ICD-10-CM

## 2020-05-11 MED ORDER — OXCARBAZEPINE 300 MG PO TABS: 450.0000 mg | ORAL_TABLET | Freq: Every day | ORAL | 0 refills | Status: DC

## 2020-05-11 MED ORDER — CLONAZEPAM 0.5 MG PO TABS: ORAL_TABLET | ORAL | 2 refills | Status: DC

## 2020-05-11 MED ORDER — LURASIDONE HCL 40 MG PO TABS: 40.0000 mg | ORAL_TABLET | Freq: Every day | ORAL | 1 refills | Status: DC

## 2020-05-11 MED ORDER — LURASIDONE HCL 40 MG PO TABS: 40.0000 mg | ORAL_TABLET | Freq: Every day | ORAL | 0 refills | Status: DC

## 2020-05-11 MED ORDER — LATUDA 20 MG PO TABS: 20.0000 mg | ORAL_TABLET | Freq: Every day | ORAL | 0 refills | Status: DC

## 2020-05-11 NOTE — Progress Notes (Signed)
DASHANAE [facility]IUS [id number] 12-Oct-1985 35 y.o.  Subjective:   Patient ID:  Bridget Rich is a 35 y.o. (DOB May 23, 1985) female.  Chief Complaint:  Chief Complaint  Patient presents with  . Depression  . Anxiety    HPI Bridget Rich presents to the office today for follow-up of anxiety and depression. She reports, "not fine, but functioning." She reports that she has been very anxious. Has had some panic attacks. Had shortness of breath last week. Frequent worry and anxious thoughts. Husband has noticed that she is asking him to order food for her at a restaurant and did not do this in the past. She reports that she is easily irritable. Has been crying at night. Has difficulty falling asleep. On the weekends she is usually awake until 2:30 am. Difficulty getting up in the morning. Mood has been depressed. Energy is low- "I feel like I have none." She reports that motivation has been very low. Has been letting some things go that she would normally attend to. She reports that her concentration has been difficult. She reports that she has been impulsively shopping at times. She reports that she is binge eating at times. Reports that she is eating mindlessly while watching TV. Reports that she has not been drinking ETOH recently and reports that she physically feels worse and anxiety is much worse the week after drinking. Reports ETOH use was heavy in 2020. History of other impulsive behaviors. Reports that impulsive behavior tends to happen more when mood is lower. Denies any recent elevated moods. She reports taking on more things. Denies SI.   Has gotten married and is changing name to [employer]. Contemplating trying to conceive at some point in the future. Moved into apartment together in November.   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   Has been seeing 2 therapists, one for CBT and another for accelerated  resolution therapy. She reports that this has been helpful.  Taking Klonopin 0.5 mg two tabs every night.   Past Psychiatric Medication Trials: Trileptal Prozac- Tolerated at 10 mg po qd. More anxious at 20 mg. Took for awhile. wellbutrin Sr-adverse reaction Rexulti Abilify- helped initially and then had concentration issues. [place] Office Visit from 05/08/2018 in [place]  Total GAD-7 Score 0    PHQ2-9   [place] Office Visit from 05/08/2018 in [place]  PHQ-2 Total Score 1  PHQ-9 Total Score 1       Review of Systems:  Review of Systems  Musculoskeletal: Negative for gait problem.  Neurological: Negative for tremors.  Psychiatric/Behavioral:       Please refer to HPI    Medications: I have reviewed the patient's current medications.  Current Outpatient Medications  Medication Sig Dispense Refill  . Biotin w/ Vitamins C & E (HAIR/SKIN/NAILS PO) Take by mouth.    . Calcium Carbonate (CALCIUM 600 PO) Take by mouth 2 (two) times daily.    . Cholecalciferol (VITAMIN D) 2000 units CAPS Take by mouth 2 (two) times daily.    . cyanocobalamin 1000 MCG tablet Take 1,000 mcg by mouth daily.    Blanchie Dessert Chiro-Inositol 2000-50 MG PACK Take by mouth. Taking for PCOS.    Marland Kitchen levonorgestrel (MIRENA) 20 MCG/24HR IUD 1 each by Intrauterine route once.    . lurasidone (LATUDA) 40 MG TABS tablet Take 1  tablet (40 mg total) by mouth daily with supper. 30 tablet 1  . Omega-3 Fatty Acids (FISH OIL) 1360 MG CAPS Take by mouth.    . Prenatal Vit-Fe Fumarate-FA (PRENATAL PO) Take by mouth.    . Pyridoxine HCl (VITAMIN B6) 200 MG TABS Take by mouth.    . vitamin C (ASCORBIC ACID) 500 MG tablet Take 500 mg by mouth daily.    Derrill Memo ON 05/19/2020] clonazePAM (KLONOPIN) 0.5 MG tablet TAKE 1 TO 2 TABLETS BY MOUTH AT BEDTIME AS NEEDED 60 tablet 2  . lurasidone (LATUDA) 20 MG TABS tablet Take 1 tablet (20 mg total) by  mouth daily with supper for 7 days. 7 tablet 0  . lurasidone (LATUDA) 40 MG TABS tablet Take 1 tablet (40 mg total) by mouth daily with breakfast. 30 tablet 0  . Oxcarbazepine (TRILEPTAL) 300 MG tablet Take 1.5 tablets (450 mg total) by mouth at bedtime. 135 tablet 0   No current facility-administered medications for this visit.    Medication Side Effects: None  Allergies:  Allergies  Allergen Reactions  . Augmentin [Amoxicillin-Pot Clavulanate] Other (See Comments)    unknown  . Ceclor [Cefaclor] Other (See Comments)    unknown  . Other Other (See Comments)    IBU - s/p bariatric surgery    Past Medical History:  Diagnosis Date  . Chicken pox   . Depression with anxiety   . GERD (gastroesophageal reflux disease)   . IUD (intrauterine device) in place   . PCOS (polycystic ovarian syndrome)     Family History  Problem Relation Age of Onset  . Diabetes Father   . Depression Father   . Hypertension Father   . Hyperlipidemia Father   . Mental illness Father   . Anxiety disorder Sister   . Depression Maternal Grandfather   . Diabetes Maternal Grandfather   . Hearing loss Maternal Grandfather   . Heart disease Maternal Grandfather   . Kidney disease Maternal Grandfather   . Mental illness Maternal Grandfather   . Depression Maternal Grandmother   . Diabetes Maternal Grandmother   . COPD Paternal Grandfather   . Alcohol abuse Paternal Grandfather   . Asthma Paternal Grandfather   . Diabetes Paternal Grandfather   . Heart disease Paternal Grandfather   . Hyperlipidemia Paternal Grandfather   . Hypertension Paternal Grandfather   . Bipolar disorder Cousin   . Anxiety disorder Sister   . Hypertension Mother   . Lung cancer Paternal Grandmother   . Depression Paternal Grandmother     Social History   Socioeconomic History  . Marital status: Married    Spouse name: Not on file  . Number of children: Not on file  . Years of education: Not on file  . Highest  education level: Not on file  Occupational History  . Not on file  Tobacco Use  . Smoking status: Never Smoker  . Smokeless tobacco: Never Used  Vaping Use  . Vaping Use: Never used  Substance and Sexual Activity  . Alcohol use: Yes    Comment: Occasionally 6-10 over a month   . Drug use: No  . Sexual activity: Yes    Partners: Male    Comment: iud  Other Topics Concern  . Not on file  Social History Narrative   Marital status/children/pets: single   Education/employment: Master's degree, Psychologist, clinical.    Safety:      -Wears a bicycle helmet riding a bike: Yes     -smoke  alarm in the home:Yes     - wears seatbelt: Yes     - Feels safe in their relationships: Yes   Social Determinants of Health   Financial Resource Strain: Not on file  Food Insecurity: Not on file  Transportation Needs: Not on file  Physical Activity: Not on file  Stress: Not on file  Social Connections: Not on file  Intimate Partner Violence: Not on file    Past Medical History, Surgical history, Social history, and Family history were reviewed and updated as appropriate.   Please see review of systems for further details on the patient's review from today.   Objective:   Physical Exam:  There were no vitals taken for this visit.  Physical Exam Constitutional:      General: She is not in acute distress. Musculoskeletal:        General: No deformity.  Neurological:     Mental Status: She is alert and oriented to person, place, and time.     Coordination: Coordination normal.  Psychiatric:        Attention and Perception: Attention and perception normal. She does not perceive auditory or visual hallucinations.        Mood and Affect: Mood is anxious and depressed. Affect is not labile, blunt, angry or inappropriate.        Speech: Speech normal.        Behavior: Behavior normal.        Thought Content: Thought content normal. Thought content is not paranoid or delusional.  Thought content does not include homicidal or suicidal ideation. Thought content does not include homicidal or suicidal plan.        Cognition and Memory: Cognition and memory normal.        Judgment: Judgment normal.     Comments: Insight intact     Lab Review:     Component Value Date/Time   NA 136 (A) 01/25/2017 0000   K 4.1 01/25/2017 0000   CL 102 11/20/2014 0243   CO2 25 11/20/2014 0041   GLUCOSE 103 (H) 11/20/2014 0243   BUN 16 11/20/2014 0243   CREATININE 0.6 01/25/2017 0000   CREATININE 0.80 11/20/2014 0243   CALCIUM 9.3 11/20/2014 0041   AST 23 01/25/2017 0000   ALT 24 01/25/2017 0000   GFRNONAA >60 11/20/2014 0041   GFRAA >60 11/20/2014 0041       Component Value Date/Time   WBC 8.7 11/20/2014 0041   RBC 4.67 11/20/2014 0041   HGB 13.6 11/20/2014 0243   HCT 40.0 11/20/2014 0243   PLT 303 11/20/2014 0041   MCV 84.8 11/20/2014 0041   MCH 28.5 11/20/2014 0041   MCHC 33.6 11/20/2014 0041   RDW 13.4 11/20/2014 0041    No results found for: POCLITH, LITHIUM   No results found for: PHENYTOIN, PHENOBARB, VALPROATE, CBMZ   .res Assessment: Plan:   Discussed potential benefits, risks, and side effects of Latuda to include safety data during pregnancy. Discussed potential metabolic side effects associated with atypical antipsychotics, as well as potential risk for movement side effects. Advised pt to contact office if movement side effects occur. Pt agrees to trial of Latuda. Will start Latuda 20 mg daily with supper for one week, then increase to 40 mg daily for mood symptoms. Discussed that Kasandra Knudsen is not approved for anxiety, however it may be helpful for her anxiety and insomnia. Will continue Trileptal 450 mg po QHS for mood stabilization.  Continue Klonopin 0.5 mg 1-2 tablets po QHS  prn insomnia.  Pt to follow-up in 1-2 months or sooner if clinically indicated.  Patient advised to contact office with any questions, adverse effects, or acute worsening in signs and  symptoms.  Bridget Rich was seen today for depression and anxiety.  Diagnoses and all orders for this visit:  Episodic mood disorder (HCC) -     lurasidone (LATUDA) 20 MG TABS tablet; Take 1 tablet (20 mg total) by mouth daily with supper for 7 days. -     lurasidone (LATUDA) 40 MG TABS tablet; Take 1 tablet (40 mg total) by mouth daily with breakfast. -     lurasidone (LATUDA) 40 MG TABS tablet; Take 1 tablet (40 mg total) by mouth daily with supper.  Anxiety disorder, unspecified type -     clonazePAM (KLONOPIN) 0.5 MG tablet; TAKE 1 TO 2 TABLETS BY MOUTH AT BEDTIME AS NEEDED -     Oxcarbazepine (TRILEPTAL) 300 MG tablet; Take 1.5 tablets (450 mg total) by mouth at bedtime.  Insomnia secondary to anxiety -     clonazePAM (KLONOPIN) 0.5 MG tablet; TAKE 1 TO 2 TABLETS BY MOUTH AT BEDTIME AS NEEDED -     Oxcarbazepine (TRILEPTAL) 300 MG tablet; Take 1.5 tablets (450 mg total) by mouth at bedtime.     Please see After Visit Summary for patient specific instructions.  Future Appointments  Date Time Provider [place]  06/30/2020  8:30 AM Thayer Headings, PMHNP CP-CP None    No orders of the defined types were placed in this encounter.   -------------------------------

## 2020-06-30 ENCOUNTER — Ambulatory Visit (INDEPENDENT_AMBULATORY_CARE_PROVIDER_SITE_OTHER): Payer: 59 | Admitting: Psychiatry

## 2020-06-30 ENCOUNTER — Encounter: Payer: Self-pay | Admitting: Psychiatry

## 2020-06-30 ENCOUNTER — Other Ambulatory Visit: Payer: Self-pay

## 2020-06-30 DIAGNOSIS — F39 Unspecified mood [affective] disorder: Secondary | ICD-10-CM

## 2020-06-30 DIAGNOSIS — F419 Anxiety disorder, unspecified: Secondary | ICD-10-CM | POA: Diagnosis not present

## 2020-06-30 DIAGNOSIS — F5105 Insomnia due to other mental disorder: Secondary | ICD-10-CM

## 2020-06-30 MED ORDER — CLONAZEPAM 0.5 MG PO TABS
ORAL_TABLET | ORAL | 2 refills | Status: DC
Start: 2020-07-17 — End: 2021-02-02

## 2020-06-30 MED ORDER — SERTRALINE HCL 25 MG PO TABS: ORAL_TABLET | ORAL | 1 refills | Status: DC

## 2020-06-30 NOTE — Progress Notes (Signed)
Bridget Rich [id number] [birth year] 35 y.o.  Subjective:   Patient ID:  Bridget Rich is a 35 y.o. (DOB Jul 01, 1985) female.  Chief Complaint:  Chief Complaint  Patient presents with  . Anxiety  . Depression    HPI Bridget Rich presents to the office today for follow-up of mood disturbance and anxiety. She reports that she had severe restlessness when she increased Latuda to 40 mg after a few days. She then stopped Latuda 40 mg after 5 days. She reports, "I'm not well... a rut, I guess." Describes mood as "really low."  She reports irritability in response to anxiety and triggers. Has been cancelling plans with friends and has been more withdrawn. She reports that she has been anxious. She reports that she has difficulty falling and staying asleep and would not be able to fall asleep without Klonopin. She reports that she is not exercising regularly or eating healthy currently. She reports that her energy is low. Motivation is also low. Concentration varies depending on task. Denies SI.   She reports that she has impulsive shopping in response to triggers and stressors.   She reports that she and her therapist have started working through the [employer].    Past Psychiatric Medication Trials: Trileptal Prozac- Tolerated at 10 mg po qd. More anxious at 20 mg. Took for awhile. wellbutrin Sr-adverse reaction Rexulti Abilify- helped initially and then had concentration issues Latuda- Akathisia Klonopin   GAD-7   Flowsheet Row Office Visit from 05/08/2018 in [place]  Total GAD-7 Score 0    PHQ2-9   [place] Office Visit from 05/08/2018 in [place]  PHQ-2 Total Score 1  PHQ-9 Total Score 1       Review of Systems:  Review of Systems  Gastrointestinal: Negative.   Musculoskeletal: Negative for gait problem.  Neurological: Negative for tremors and headaches.  Psychiatric/Behavioral:       Please refer to HPI     Medications: I have reviewed the patient's current medications.  Current Outpatient Medications  Medication Sig Dispense Refill  . Biotin w/ Vitamins C & E (HAIR/SKIN/NAILS PO) Take by mouth.    . Calcium Carbonate (CALCIUM 600 PO) Take by mouth 2 (two) times daily.    . Cholecalciferol (VITAMIN D) 2000 units CAPS Take by mouth 2 (two) times daily.    . cyanocobalamin 1000 MCG tablet Take 1,000 mcg by mouth daily.    Bridget Rich Chiro-Inositol 2000-50 MG PACK Take by mouth. Taking for PCOS.    Marland Kitchen levonorgestrel (MIRENA) 20 MCG/24HR IUD 1 each by Intrauterine route once.    . Omega-3 Fatty Acids (FISH OIL) 1360 MG CAPS Take by mouth.    . Oxcarbazepine (TRILEPTAL) 300 MG tablet Take 1.5 tablets (450 mg total) by mouth at bedtime. 135 tablet 0  . Prenatal Vit-Fe Fumarate-FA (PRENATAL PO) Take by mouth.    . Pyridoxine HCl (VITAMIN B6) 200 MG TABS Take by mouth.    . sertraline (ZOLOFT) 25 MG tablet Take 1/2 tablet daily for 1 week, then increase to 1 tablet daily for 1 week, then increase to 1.5 tablets daily for one week, then increase to 2 tablets daily 60 tablet 1  . vitamin C (ASCORBIC ACID) 500 MG tablet Take 500 mg by mouth daily.    Derrill Memo ON 07/17/2020] clonazePAM (KLONOPIN) 0.5 MG tablet TAKE 1 TO 2 TABLETS BY MOUTH AT BEDTIME AS NEEDED 60 tablet 2   No  current facility-administered medications for this visit.    Medication Side Effects: Other: Akathisia with Latuda  Allergies:  Allergies  Allergen Reactions  . Augmentin [Amoxicillin-Pot Clavulanate] Other (See Comments)    unknown  . Ceclor [Cefaclor] Other (See Comments)    unknown  . Other Other (See Comments)    IBU - s/p bariatric surgery    Past Medical History:  Diagnosis Date  . Chicken pox   . Depression with anxiety   . GERD (gastroesophageal reflux disease)   . IUD (intrauterine device) in place   . PCOS (polycystic ovarian syndrome)     Family History  Problem Relation Age of Onset  . Diabetes  Father   . Depression Father   . Hypertension Father   . Hyperlipidemia Father   . Mental illness Father   . Anxiety disorder Sister   . Depression Maternal Grandfather   . Diabetes Maternal Grandfather   . Hearing loss Maternal Grandfather   . Heart disease Maternal Grandfather   . Kidney disease Maternal Grandfather   . Mental illness Maternal Grandfather   . Depression Maternal Grandmother   . Diabetes Maternal Grandmother   . COPD Paternal Grandfather   . Alcohol abuse Paternal Grandfather   . Asthma Paternal Grandfather   . Diabetes Paternal Grandfather   . Heart disease Paternal Grandfather   . Hyperlipidemia Paternal Grandfather   . Hypertension Paternal Grandfather   . Bipolar disorder Cousin   . Anxiety disorder Sister   . Hypertension Mother   . Lung cancer Paternal Grandmother   . Depression Paternal Grandmother     Social History   Socioeconomic History  . Marital status: Married    Spouse name: Not on file  . Number of children: Not on file  . Years of education: Not on file  . Highest education level: Not on file  Occupational History  . Not on file  Tobacco Use  . Smoking status: Never Smoker  . Smokeless tobacco: Never Used  Vaping Use  . Vaping Use: Never used  Substance and Sexual Activity  . Alcohol use: Yes    Comment: Occasionally 6-10 over a month   . Drug use: No  . Sexual activity: Yes    Partners: Male    Comment: iud  Other Topics Concern  . Not on file  Social History Narrative   Marital status/children/pets: single   Education/employment: Master's degree, Psychologist, clinical.    Safety:      -Wears a bicycle helmet riding a bike: Yes     -smoke alarm in the home:Yes     - wears seatbelt: Yes     - Feels safe in their relationships: Yes   Social Determinants of Health   Financial Resource Strain: Not on file  Food Insecurity: Not on file  Transportation Needs: Not on file  Physical Activity: Not on file  Stress: Not  on file  Social Connections: Not on file  Intimate Partner Violence: Not on file    Past Medical History, Surgical history, Social history, and Family history were reviewed and updated as appropriate.   Please see review of systems for further details on the patient's review from today.   Objective:   Physical Exam:  There were no vitals taken for this visit.  Physical Exam Constitutional:      General: She is not in acute distress. Musculoskeletal:        General: No deformity.  Neurological:     Mental Status: She is  alert and oriented to person, place, and time.     Coordination: Coordination normal.  Psychiatric:        Attention and Perception: Attention and perception normal. She does not perceive auditory or visual hallucinations.        Mood and Affect: Mood is anxious. Affect is not labile, blunt, angry or inappropriate.        Speech: Speech normal.        Behavior: Behavior normal.        Thought Content: Thought content normal. Thought content is not paranoid or delusional. Thought content does not include homicidal or suicidal ideation. Thought content does not include homicidal or suicidal plan.        Cognition and Memory: Cognition and memory normal.        Judgment: Judgment normal.     Comments: Insight intact Dysphoric mood     Lab Review:     Component Value Date/Time   NA 136 (A) 01/25/2017 0000   K 4.1 01/25/2017 0000   CL 102 11/20/2014 0243   CO2 25 11/20/2014 0041   GLUCOSE 103 (H) 11/20/2014 0243   BUN 16 11/20/2014 0243   CREATININE 0.6 01/25/2017 0000   CREATININE 0.80 11/20/2014 0243   CALCIUM 9.3 11/20/2014 0041   AST 23 01/25/2017 0000   ALT 24 01/25/2017 0000   GFRNONAA >60 11/20/2014 0041   GFRAA >60 11/20/2014 0041       Component Value Date/Time   WBC 8.7 11/20/2014 0041   RBC 4.67 11/20/2014 0041   HGB 13.6 11/20/2014 0243   HCT 40.0 11/20/2014 0243   PLT 303 11/20/2014 0041   MCV 84.8 11/20/2014 0041   MCH 28.5  11/20/2014 0041   MCHC 33.6 11/20/2014 0041   RDW 13.4 11/20/2014 0041    No results found for: POCLITH, LITHIUM   No results found for: PHENYTOIN, PHENOBARB, VALPROATE, CBMZ   .res Assessment: Plan:   Pt seen for 30 minutes and time spent discussing diagnosis. Agreed that her s/s have not clearly met full diagnostic criteria for Bipolar D/O and that mood s/s could be in response to PTSD triggers. Discussed that anxiety has been a consistent chief complaint and that she has only had treatment with one SSRI, Prozac. Discussed considering re-trial of a different SSRI, such as Zoloft, to target anxiety and mood s/s. Discussed potential benefits, risks, and side effects of Sertraline for mood and anxiety s/s. Discussed that Sertraline is considered to be one of the safer psychiatric medications during pregnancy and breastfeeding since she is considering trying to conceive in the future. Discussed started Sertraline at low dose and titrating slowly to minimize risk of increased activation which seemed to have occurred with Prozac. Pt agrees to trial of Sertraline. Discussed contacting office if she experiences any s/s of hypomania to include mood lability, sleeplessness, impulsivity, elevated mood, etc.  Will start Sertraline 12.5 mg po qd for one week, then increase to 25 mg po qd for one week, then increase to 37.5 mg po qd for one week, then increase to 50 mg po qd.  Discussed continuing current doses of Trileptal and Klonopin while initiating Sertraline.  Pt to follow-up in 6-8 weeks or sooner if clinically indicated.  Patient advised to contact office with any questions, adverse effects, or acute worsening in signs and symptoms.  Bridget Rich was seen today for anxiety and depression.  Diagnoses and all orders for this visit:  Anxiety disorder, unspecified type -     sertraline (  ZOLOFT) 25 MG tablet; Take 1/2 tablet daily for 1 week, then increase to 1 tablet daily for 1 week, then increase to 1.5  tablets daily for one week, then increase to 2 tablets daily -     clonazePAM (KLONOPIN) 0.5 MG tablet; TAKE 1 TO 2 TABLETS BY MOUTH AT BEDTIME AS NEEDED  Episodic mood disorder (HCC) -     sertraline (ZOLOFT) 25 MG tablet; Take 1/2 tablet daily for 1 week, then increase to 1 tablet daily for 1 week, then increase to 1.5 tablets daily for one week, then increase to 2 tablets daily  Insomnia secondary to anxiety -     clonazePAM (KLONOPIN) 0.5 MG tablet; TAKE 1 TO 2 TABLETS BY MOUTH AT BEDTIME AS NEEDED     Please see After Visit Summary for patient specific instructions.  Future Appointments  Date Time Provider [place]  08/19/2020  9:30 AM Thayer Headings, PMHNP [facility] None    No orders of the defined types were placed in this encounter.   -------------------------------

## 2020-08-14 ENCOUNTER — Ambulatory Visit (INDEPENDENT_AMBULATORY_CARE_PROVIDER_SITE_OTHER): Payer: 59 | Admitting: Psychiatry

## 2020-08-14 ENCOUNTER — Encounter: Payer: Self-pay | Admitting: Psychiatry

## 2020-08-14 ENCOUNTER — Other Ambulatory Visit: Payer: Self-pay

## 2020-08-14 DIAGNOSIS — F419 Anxiety disorder, unspecified: Secondary | ICD-10-CM | POA: Diagnosis not present

## 2020-08-14 DIAGNOSIS — F39 Unspecified mood [affective] disorder: Secondary | ICD-10-CM

## 2020-08-14 DIAGNOSIS — F5105 Insomnia due to other mental disorder: Secondary | ICD-10-CM

## 2020-08-14 MED ORDER — SERTRALINE HCL 50 MG PO TABS: 50.0000 mg | ORAL_TABLET | Freq: Every day | ORAL | 1 refills | Status: DC

## 2020-08-14 MED ORDER — OXCARBAZEPINE 300 MG PO TABS: 450.0000 mg | ORAL_TABLET | Freq: Every day | ORAL | 1 refills | Status: DC

## 2020-08-14 NOTE — Progress Notes (Signed)
Bridget Rich. [id number] 07-04-1985 35 y.o.  Subjective:   Patient ID:  Bridget Rich is a 35 y.o. (DOB 1986/01/27) female.  Chief Complaint:  Chief Complaint  Patient presents with  . Follow-up    Anxiety, depression, insomnia    HPI Bridget Rich presents to the office today for follow-up of anxiety and depression. She reports that she has been more productive. She reports that her husband said her mood has been "better... lighter, less irritable." She reports anxiety has been ok and has been less. Denies affective dulling. She reports that she feels "less reactive." She reports that at times she will feel a "little excited" and slightly more verbal. She reports she will question her behavior after social events and wonder if she talked too much. Denies any impulsivity or excessive spending. She has not been drinking ETOH.  Denies panic s/s. She reports that she is falling asleep ok and awakening sometime between 2-5 am. Able to return to sleep easily most nights. Appetite has been good. Energy has been ok. Motivation has been good and is now doing more things she needs to be doing. Concentration has been ok. Denies SI.   She is considering trying to come off Klonopin. She was taking 2 tabs of Klonopin nightly and decreased to 1.5 tabs the last 2 weeks.   She continues to see therapist weekly.   Has been getting limited hours at work. 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    [place] Office Visit from 05/08/2018 in [place]  Total GAD-7 Score 0    PHQ2-9   [place] Office Visit from 05/08/2018 in [place]  PHQ-2 Total Score 1  PHQ-9 Total Score 1       Review of Systems:  Review of Systems  Musculoskeletal: Negative for gait problem.  Neurological: Positive for headaches. Negative for tremors.  Psychiatric/Behavioral:       Please refer to HPI    Had COVID 3.5-4 weeks ago.   Medications: I  have reviewed the patient's current medications.  Current Outpatient Medications  Medication Sig Dispense Refill  . Biotin w/ Vitamins C & E (HAIR/SKIN/NAILS PO) Take by mouth.    . Calcium Carbonate (CALCIUM 600 PO) Take by mouth 2 (two) times daily.    . Cholecalciferol (VITAMIN D) 2000 units CAPS Take by mouth 2 (two) times daily.    . clonazePAM (KLONOPIN) 0.5 MG tablet TAKE 1 TO 2 TABLETS BY MOUTH AT BEDTIME AS NEEDED 60 tablet 2  . cyanocobalamin 1000 MCG tablet Take 1,000 mcg by mouth daily.    Blanchie Dessert Chiro-Inositol 2000-50 MG PACK Take by mouth. Taking for PCOS.    Marland Kitchen levonorgestrel (MIRENA) 20 MCG/24HR IUD 1 each by Intrauterine route once.    . Omega-3 Fatty Acids (FISH OIL) 1360 MG CAPS Take by mouth.    . Prenatal Vit-Fe Fumarate-FA (PRENATAL PO) Take by mouth.    . Pyridoxine HCl (VITAMIN B6) 200 MG TABS Take by mouth.    . vitamin C (ASCORBIC ACID) 500 MG tablet Take 500 mg by mouth daily.    . Oxcarbazepine (TRILEPTAL) 300 MG tablet Take 1.5 tablets (450 mg total) by mouth at bedtime. 135 tablet 1  . sertraline (ZOLOFT) 50 MG tablet Take 1 tablet (50 mg total) by mouth daily. 90 tablet 1   No current facility-administered medications for this visit.    Medication Side Effects: None  Allergies:  Allergies  Allergen Reactions  . Augmentin [Amoxicillin-Pot Clavulanate] Other (See Comments)    unknown  . Ceclor [Cefaclor] Other (See Comments)    unknown  . Other Other (See Comments)    IBU - s/p bariatric surgery    Past Medical History:  Diagnosis Date  . Chicken pox   . Depression with anxiety   . GERD (gastroesophageal reflux disease)   . IUD (intrauterine device) in place   . PCOS (polycystic ovarian syndrome)     Past Medical History, Surgical history, Social history, and Family history were reviewed and updated as appropriate.   Please see review of systems for further details on the patient's review from today.   Objective:   Physical Exam:   There were no vitals taken for this visit.  Physical Exam Constitutional:      General: She is not in acute distress. Musculoskeletal:        General: No deformity.  Neurological:     Mental Status: She is alert and oriented to person, place, and time.     Coordination: Coordination normal.  Psychiatric:        Attention and Perception: Attention and perception normal. She does not perceive auditory or visual hallucinations.        Mood and Affect: Mood normal. Mood is not anxious or depressed. Affect is not labile, blunt, angry or inappropriate.        Speech: Speech normal.        Behavior: Behavior normal.        Thought Content: Thought content normal. Thought content is not paranoid or delusional. Thought content does not include homicidal or suicidal ideation. Thought content does not include homicidal or suicidal plan.        Cognition and Memory: Cognition and memory normal.        Judgment: Judgment normal.     Comments: Insight intact     Lab Review:     Component Value Date/Time   NA 136 (A) 01/25/2017 0000   K 4.1 01/25/2017 0000   CL 102 11/20/2014 0243   CO2 25 11/20/2014 0041   GLUCOSE 103 (H) 11/20/2014 0243   BUN 16 11/20/2014 0243   CREATININE 0.6 01/25/2017 0000   CREATININE 0.80 11/20/2014 0243   CALCIUM 9.3 11/20/2014 0041   AST 23 01/25/2017 0000   ALT 24 01/25/2017 0000   GFRNONAA >60 11/20/2014 0041   GFRAA >60 11/20/2014 0041       Component Value Date/Time   WBC 8.7 11/20/2014 0041   RBC 4.67 11/20/2014 0041   HGB 13.6 11/20/2014 0243   HCT 40.0 11/20/2014 0243   PLT 303 11/20/2014 0041   MCV 84.8 11/20/2014 0041   MCH 28.5 11/20/2014 0041   MCHC 33.6 11/20/2014 0041   RDW 13.4 11/20/2014 0041    No results found for: POCLITH, LITHIUM   No results found for: PHENYTOIN, PHENOBARB, VALPROATE, CBMZ   .res Assessment: Plan:   Patient seen for 30 minutes and time spent counseling the patient regarding treatment options.  Discussed  continuing sertraline at 50 mg daily since she has experienced an overall improvement in mood and anxiety signs and symptoms. Her questions about reducing dose of Klonopin if possible.  Agree with plan to gradually decrease dose.  Recommend continuing Klonopin 0.5 mg 1-1/2 tablets at bedtime for a full 4 weeks before reducing dose further.  Recommend reducing Klonopin by 1/2 tablet every 4 weeks to minimize risk of benzodiazepine withdrawal. Discussed considering  decrease of Trileptal in the future once the lowest possible effective dose of Klonopin is determined since Trileptal may help prevent benzodiazepine withdrawal signs and symptoms and may also be helpful for her insomnia and anxiety and help with decreasing need for Klonopin. Recommend continuing psychotherapy. Patient to follow-up in 2 months or sooner if clinically indicated. Patient advised to contact office with any questions, adverse effects, or acute worsening in signs and symptoms.  Bridget Rich was seen today for follow-up.  Diagnoses and all orders for this visit:  Anxiety disorder, unspecified type -     Oxcarbazepine (TRILEPTAL) 300 MG tablet; Take 1.5 tablets (450 mg total) by mouth at bedtime. -     sertraline (ZOLOFT) 50 MG tablet; Take 1 tablet (50 mg total) by mouth daily.  Insomnia secondary to anxiety -     Oxcarbazepine (TRILEPTAL) 300 MG tablet; Take 1.5 tablets (450 mg total) by mouth at bedtime.  Episodic mood disorder (HCC) -     sertraline (ZOLOFT) 50 MG tablet; Take 1 tablet (50 mg total) by mouth daily.     Please see After Visit Summary for patient specific instructions.  Future Appointments  Date Time Provider [place]  11/06/2020  8:30 AM Thayer Headings, PMHNP [facility] None    No orders of the defined types were placed in this encounter.   -------------------------------

## 2020-08-19 ENCOUNTER — Ambulatory Visit: Payer: 59 | Admitting: Psychiatry

## 2020-08-25 DIAGNOSIS — F39 Unspecified mood [affective] disorder: Secondary | ICD-10-CM | POA: Insufficient documentation

## 2020-08-26 ENCOUNTER — Encounter: Payer: Self-pay | Admitting: Neurology

## 2020-09-04 ENCOUNTER — Other Ambulatory Visit: Payer: Self-pay | Admitting: Psychiatry

## 2020-09-04 DIAGNOSIS — F39 Unspecified mood [affective] disorder: Secondary | ICD-10-CM

## 2020-09-04 DIAGNOSIS — F419 Anxiety disorder, unspecified: Secondary | ICD-10-CM

## 2020-09-07 ENCOUNTER — Emergency Department (HOSPITAL_BASED_OUTPATIENT_CLINIC_OR_DEPARTMENT_OTHER)
Admission: EM | Admit: 2020-09-07 | Discharge: 2020-09-08 | Disposition: A | Discharge status: Home / Self Care | Payer: Managed Care, Other (non HMO) | Attending: Emergency Medicine | Admitting: Emergency Medicine

## 2020-09-07 ENCOUNTER — Other Ambulatory Visit: Payer: Self-pay

## 2020-09-07 ENCOUNTER — Encounter (HOSPITAL_BASED_OUTPATIENT_CLINIC_OR_DEPARTMENT_OTHER): Payer: Self-pay | Admitting: Emergency Medicine

## 2020-09-07 DIAGNOSIS — M542 Cervicalgia: Secondary | ICD-10-CM | POA: Insufficient documentation

## 2020-09-07 DIAGNOSIS — R519 Headache, unspecified: Secondary | ICD-10-CM | POA: Insufficient documentation

## 2020-09-07 LAB — CBC WITH DIFFERENTIAL/PLATELET
Abs Immature Granulocytes: 0.03 10*3/uL (ref 0.00–0.07)
Basophils Absolute: 0.1 10*3/uL (ref 0.0–0.1)
Basophils Relative: 1 %
Eosinophils Absolute: 0.2 10*3/uL (ref 0.0–0.5)
Eosinophils Relative: 2 %
HCT: 39.2 % (ref 36.0–46.0)
Hemoglobin: 13 g/dL (ref 12.0–15.0)
Immature Granulocytes: 0 %
Lymphocytes Relative: 37 %
Lymphs Abs: 4 10*3/uL (ref 0.7–4.0)
MCH: 29.7 pg (ref 26.0–34.0)
MCHC: 33.2 g/dL (ref 30.0–36.0)
MCV: 89.7 fL (ref 80.0–100.0)
Monocytes Absolute: 0.9 10*3/uL (ref 0.1–1.0)
Monocytes Relative: 9 %
Neutro Abs: 5.6 10*3/uL (ref 1.7–7.7)
Neutrophils Relative %: 51 %
Platelets: 278 10*3/uL (ref 150–400)
RBC: 4.37 MIL/uL (ref 3.87–5.11)
RDW: 12.3 % (ref 11.5–15.5)
WBC: 10.8 10*3/uL — ABNORMAL HIGH (ref 4.0–10.5)
nRBC: 0 % (ref 0.0–0.2)

## 2020-09-07 MED ORDER — DIPHENHYDRAMINE HCL 50 MG/ML IJ SOLN
50.0000 mg | Freq: Once | INTRAMUSCULAR | Status: AC
  Administered 2020-09-07: 50 mg via INTRAVENOUS
  Filled 2020-09-07: qty 1

## 2020-09-07 MED ORDER — METOCLOPRAMIDE HCL 5 MG/ML IJ SOLN
10.0000 mg | Freq: Once | INTRAMUSCULAR | Status: AC
  Administered 2020-09-07: 10 mg via INTRAVENOUS
  Filled 2020-09-07: qty 2

## 2020-09-07 NOTE — ED Triage Notes (Addendum)
Pt presents to ED POV. Pt c/o R neck pain, dizziness, and HA. Pt reports that she was working out this morning and rolled too far onto neck and has had s/s since. Pt reports HA, dizziness, and photosensitivity intermittently today. No blood thinners, no LOC

## 2020-09-07 NOTE — ED Notes (Signed)
Pt given a heat pack placed in a pillow case for rt side of neck

## 2020-09-07 NOTE — ED Provider Notes (Signed)
[name] Provider Note   CSN: [id number] Arrival date & time: 09/07/20  1855     History Chief Complaint  Patient presents with  . Neck Injury    Bridget Rich is a 35 y.o. female with a history of low back pain, anxiety disorder.  Patient presents with chief complaint of neck pain.  Patient reports that at approximately 0630 while working out she rolled onto her neck from a supine position.  Patient feels that she "rolled too far," because after this she had immediate pain to her neck.  Patient reports that her neck pain has gradually improved throughout the day.  Patient has no pain at rest however notices pain with range of motion.  Patient rates pain 5/10 on the pain scale.  Patient reports that pain is worse to the right side of her neck.  Patient developed headache and intermittent dizziness throughout the day.  Patient reports that headache began approximately 1000.  Pain has been constant since then.  Pain was gradual in onset and has gotten progressively worse over time.  Patient rates pain 10/10 on the pain scale.  Patient endorses photosensitivity to light.  Patient reports no improvement with Tylenol.  Patient states that dizziness is intermittent.  Dizziness only lasts for "seconds.".  Dizziness has come on at rest and while standing.  Patient was able to complete her entire shift at work without difficulty.  Patient denies any bowel or bladder dysfunction, facial asymmetry, slurred speech, numbness, weakness, seizures, syncope, visual disturbance, fevers, chills.    HPI     Past Medical History:  Diagnosis Date  . Chicken pox   . Depression with anxiety   . GERD (gastroesophageal reflux disease)   . IUD (intrauterine device) in place   . PCOS (polycystic ovarian syndrome)     Patient Active Problem List   Diagnosis Date Noted  . Low back pain 05/02/2019  . Nonallopathic lesion of lumbar region 05/02/2019  . Nonallopathic lesion  of sacral region 05/02/2019  . Nonallopathic lesion of thoracic region 05/02/2019  . Nonallopathic lesion of cervical region 05/02/2019  . Nonallopathic lesion of rib cage 05/02/2019  . 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 IUD (intrauterine device) in place 05/08/2018  . Anxiety disorder 01/28/2018    Past Surgical History:  Procedure Laterality Date  . BARIATRIC SURGERY  12/2016  . MOUTH SURGERY  2016     OB History    Gravida  0   Para  0   Term  0   Preterm  0   AB  0   Living  0     SAB  0   IAB  0   Ectopic  0   Multiple  0   Live Births  0           Family History  Problem Relation Age of Onset  . Diabetes Father   . Depression Father   . Hypertension Father   . Hyperlipidemia Father   . Mental illness Father   . Anxiety disorder Sister   . Depression Maternal Grandfather   . Diabetes Maternal Grandfather   . Hearing loss Maternal Grandfather   . Heart disease Maternal Grandfather   . Kidney disease Maternal Grandfather   . Mental illness Maternal Grandfather   . Depression Maternal Grandmother   . Diabetes Maternal Grandmother   . COPD Paternal Grandfather   . Alcohol abuse Paternal Grandfather   . Asthma Paternal Grandfather   .  Diabetes Paternal Grandfather   . Heart disease Paternal Grandfather   . Hyperlipidemia Paternal Grandfather   . Hypertension Paternal Grandfather   . Bipolar disorder Cousin   . Anxiety disorder Sister   . Hypertension Mother   . Lung cancer Paternal Grandmother   . Depression Paternal Grandmother     Social History   Tobacco Use  . Smoking status: Never Smoker  . Smokeless tobacco: Never Used  Vaping Use  . Vaping Use: Never used  Substance Use Topics  . Alcohol use: Yes    Comment: Occasionally 6-10 over a month   . Drug use: No    Home Medications Prior to Admission medications   Medication Sig Start Date End Date Taking? Authorizing Provider  Biotin w/ Vitamins C & E  (HAIR/SKIN/NAILS PO) Take by mouth.    [provider]  Calcium Carbonate (CALCIUM 600 PO) Take by mouth 2 (two) times daily.    [provider]  Cholecalciferol (VITAMIN D) 2000 units CAPS Take by mouth 2 (two) times daily.    [provider]  clonazePAM (KLONOPIN) 0.5 MG tablet TAKE 1 TO 2 TABLETS BY MOUTH AT BEDTIME AS NEEDED 07/17/20   Thayer Headings, PMHNP  cyanocobalamin 1000 MCG tablet Take 1,000 mcg by mouth daily.    [provider]  Inositol-D Chiro-Inositol 2000-50 MG PACK Take by mouth. Taking for PCOS.    [provider]  levonorgestrel (MIRENA) 20 MCG/24HR IUD 1 each by Intrauterine route once.    [provider]  Omega-3 Fatty Acids (FISH OIL) 1360 MG CAPS Take by mouth.    [provider]  Oxcarbazepine (TRILEPTAL) 300 MG tablet Take 1.5 tablets (450 mg total) by mouth at bedtime. 08/14/20   Thayer Headings, PMHNP  Prenatal Vit-Fe Fumarate-FA (PRENATAL PO) Take by mouth.    [provider]  Pyridoxine HCl (VITAMIN B6) 200 MG TABS Take by mouth.    [provider]  sertraline (ZOLOFT) 50 MG tablet Take 1 tablet (50 mg total) by mouth daily. 08/14/20   Thayer Headings, PMHNP  vitamin C (ASCORBIC ACID) 500 MG tablet Take 500 mg by mouth daily.    [provider]    Allergies    Augmentin [amoxicillin-pot clavulanate], Ceclor [cefaclor], and Other  Review of Systems   Review of Systems  Constitutional: Negative for chills and fever.  Eyes: Negative for visual disturbance.  Respiratory: Negative for shortness of breath.   Cardiovascular: Negative for chest pain.  Gastrointestinal: Negative for abdominal pain, nausea and vomiting.  Genitourinary: Negative for difficulty urinating and dysuria.  Musculoskeletal: Positive for neck pain. Negative for back pain.  Skin: Negative for color change, rash and wound.  Neurological: Positive for dizziness and headaches. Negative for tremors, seizures,  syncope, facial asymmetry, speech difficulty, weakness, light-headedness and numbness.  Psychiatric/Behavioral: Negative for confusion.    Physical Exam Updated Vital Signs BP 132/85 (BP Location: Right Arm)   Pulse (!) 50   Temp 98.7 F (37.1 C) (Oral)   Resp 14   Ht 5\' 7"  (1.702 m)   Wt 111.1 kg   SpO2 100%   BMI 38.37 kg/m   Physical Exam Vitals and nursing note reviewed.  Constitutional:      General: She is not in acute distress.    Appearance: She is not ill-appearing, toxic-appearing or diaphoretic.  Eyes:     General: No scleral icterus.       Right eye: No discharge.        Left  eye: No discharge.     Extraocular Movements: Extraocular movements intact.     Conjunctiva/sclera:     Right eye: Right conjunctiva is not injected. No chemosis, exudate or hemorrhage.    Left eye: Left conjunctiva is not injected. No chemosis, exudate or hemorrhage.    Pupils: Pupils are equal, round, and reactive to light.  Cardiovascular:     Rate and Rhythm: Normal rate.  Pulmonary:     Effort: Pulmonary effort is normal. No respiratory distress.     Breath sounds: No stridor.  Musculoskeletal:     Cervical back: Neck supple. Tenderness and bony tenderness present. No swelling, edema, deformity, erythema, signs of trauma, lacerations, rigidity, spasms, torticollis or crepitus. Pain with movement present. Decreased range of motion.     Thoracic back: No swelling, edema, deformity, signs of trauma, lacerations, spasms, tenderness or bony tenderness.     Lumbar back: No swelling, edema, deformity, signs of trauma, lacerations, spasms, tenderness or bony tenderness.     Comments: decreased range of motion to cervical neck, tenderness to bilateral trapezius and sternocleidomastoid muscles, midline cervical tenderness.  Skin:    General: Skin is warm and dry.  Neurological:     General: No focal deficit present.     Mental Status: She is alert and oriented to person, place, and time.      GCS: GCS eye subscore is 4. GCS verbal subscore is 5. GCS motor subscore is 6.     Cranial Nerves: No cranial nerve deficit or facial asymmetry.     Sensory: Sensation is intact. No sensory deficit.     Motor: No weakness, tremor, seizure activity or pronator drift.     Coordination: Romberg sign negative. Finger-Nose-Finger Test normal.     Gait: Gait is intact. Gait and tandem walk normal.     Comments: CN II-XII intact, equal grip strength, +5 strength to bilateral upper and lower extremities, sensation to light touch intact to bilateral upper and lower extremities  Psychiatric:        Behavior: Behavior is cooperative.     ED Results / Procedures / Treatments   Labs (all labs ordered are listed, but only abnormal results are displayed) Labs Reviewed  BASIC METABOLIC PANEL - Abnormal; Notable for the following components:      Result Value   Potassium 3.4 (*)    All other components within normal limits  CBC WITH DIFFERENTIAL/PLATELET - Abnormal; Notable for the following components:   WBC 10.8 (*)    All other components within normal limits    EKG None  Radiology No results found.  Procedures Procedures   Medications Ordered in ED Medications  metoCLOPramide (REGLAN) injection 10 mg (10 mg Intravenous Given 09/07/20 2323)  diphenhydrAMINE (BENADRYL) injection 50 mg (50 mg Intravenous Given 09/07/20 2323)  iohexol (OMNIPAQUE) 350 MG/ML injection 75 mL (75 mLs Intravenous Contrast Given 09/08/20 0021)    ED Course  I have reviewed the triage vital signs and the nursing notes.  Pertinent labs & imaging results that were available during my care of the patient were reviewed by me and considered in my medical decision making (see chart for details).    MDM Rules/Calculators/A&P                           Alert 35 year old female in no acute distress, nontoxic-appearing.  Patient presents with chief complaint of neck pain, headache, and dizziness after suffering a neck  injury while  working out earlier this morning.  On physical exam patient has no neurological deficit noted.  Patient has midline cervical tenderness and decreased range of motion to neck.    Will obtain basic lab work.  Will obtain head and neck to evaluate for possible dissection, subarachnoid hemorrhage or other intracranial hemorrhage.  We will give patient Reglan and Benadryl help with her headache.  BMP unremarkable.  CBC shows slight leukocytosis at 10.4.  On repeat examination patient reports improvement in her headache.  Patient now rates headache 3/10 on the pain scale.  Continues to deny any facial asymmetry, numbness, or weakness.  If CTA head and neck are unremarkable and patient has continued improvement in her headaches will discharge patient to follow-up with primary care provider and recommend short course of Robaxin.  Patient care transferred to Dr. Dayna Barker at the end of my shift. Patient presentation, ED course, and plan of care discussed with review of all pertinent labs and imaging. Please see his/her note for further details regarding further ED course and disposition.    Final Clinical Impression(s) / ED Diagnoses Final diagnoses:  None    Rx / DC Orders ED Discharge Orders    None       Dyann Ruddle 09/08/20 0126    Merrily Pew, MD 09/08/20 [phone]

## 2020-09-08 ENCOUNTER — Emergency Department (HOSPITAL_BASED_OUTPATIENT_CLINIC_OR_DEPARTMENT_OTHER): Payer: Managed Care, Other (non HMO)

## 2020-09-08 LAB — BASIC METABOLIC PANEL
Anion gap: 8 (ref 5–15)
BUN: 13 mg/dL (ref 6–20)
CO2: 27 mmol/L (ref 22–32)
Calcium: 8.9 mg/dL (ref 8.9–10.3)
Chloride: 103 mmol/L (ref 98–111)
Creatinine, Ser: 0.75 mg/dL (ref 0.44–1.00)
GFR, Estimated: >60 mL/min (ref >60)
Glucose, Bld: 84 mg/dL (ref 70–99)
Potassium: 3.4 mmol/L — ABNORMAL LOW (ref 3.5–5.1)
Sodium: 138 mmol/L (ref 135–145)

## 2020-09-08 MED ORDER — IOHEXOL 350 MG/ML SOLN
75.0000 mL | Freq: Once | INTRAVENOUS | Status: AC | PRN
  Administered 2020-09-08: 75 mL via INTRAVENOUS

## 2020-09-08 NOTE — Discharge Instructions (Addendum)
You came to the emergency department today to be evaluated for your neck pain and headache.  Your physical exam was reassuring.  The CT scan of your head and neck showed no acute abnormalities.  Your headache improved after receiving a migraine cocktail.  Please follow-up with your primary care provider.    Get help right away if: Your headache becomes severe quickly. Your headache gets worse after moderate to intense physical activity. You have repeated vomiting. You have a stiff neck. You have a loss of vision. You have problems with speech. You have pain in the eye or ear. You have muscular weakness or loss of muscle control. You lose your balance or have trouble walking. You feel faint or pass out. You have confusion. You have a seizure.

## 2020-09-11 ENCOUNTER — Telehealth: Payer: Self-pay | Admitting: Psychiatry

## 2020-09-11 NOTE — Telephone Encounter (Signed)
Pt left message stating she has questions about med Rx .She will be traveling and need to clarify some things. [phone]. Appt 8/5

## 2020-09-11 NOTE — Telephone Encounter (Signed)
Can we try to get her a earlier appt please

## 2020-09-11 NOTE — Telephone Encounter (Signed)
Pt is leaving for [facility] 7/5 and returning 10/15. JC can you work her in before 7/5?   Any day but [place].

## 2020-09-11 NOTE — Telephone Encounter (Signed)
Pt stated she will be out of town for work from July 5th-October 15th.She wants to know if her meds can be sent to a pharmacy while there or if you can prescribe her enough to have while she's gone.She also will need a earlier appt since its in august and she will be gone.

## 2020-09-11 NOTE — Telephone Encounter (Signed)
Pt appt 6/30 @ 3:00

## 2020-09-11 NOTE — Telephone Encounter (Signed)
Please review tammy's message

## 2020-10-01 ENCOUNTER — Encounter: Payer: Self-pay | Admitting: Psychiatry

## 2020-10-01 ENCOUNTER — Other Ambulatory Visit: Payer: Self-pay | Admitting: Psychiatry

## 2020-10-01 ENCOUNTER — Other Ambulatory Visit: Payer: Self-pay

## 2020-10-01 ENCOUNTER — Ambulatory Visit (INDEPENDENT_AMBULATORY_CARE_PROVIDER_SITE_OTHER): Payer: 59 | Admitting: Psychiatry

## 2020-10-01 DIAGNOSIS — F39 Unspecified mood [affective] disorder: Secondary | ICD-10-CM

## 2020-10-01 DIAGNOSIS — F5105 Insomnia due to other mental disorder: Secondary | ICD-10-CM

## 2020-10-01 DIAGNOSIS — F419 Anxiety disorder, unspecified: Secondary | ICD-10-CM

## 2020-10-01 NOTE — Progress Notes (Signed)
Bridget Rich [id number] [birth year] 35 y.o.  Subjective:   Patient ID:  Bridget Rich is a 35 y.o. (DOB [birth year]) female.  Chief Complaint:  Chief Complaint  Patient presents with   Follow-up    Anxiety, depression, insomnia    HPI Bridget Rich presents to the office today for follow-up of anxiety, depression, and insomnia. She reports, "I feel a lot better." She reports that Sertraline seems to be helpful for her mood and anxiety.  She reports that she is no longer taking Klonopin at night and stopped about a week ago after taking 0.25 mg for 3 weeks. She reports that she is now sleeping better and wakes up naturally 5:30-7 am. She reports anxiety has been "ok" with "moments of anxiety." Denies severe anxiety or panic. Reports anxiety has been manageable. She reports that her mood has been good and has been "feeling really good the last few weeks." Appetite has been ok. Energy and motivation have been good. 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 Occasional word finding errors or using the wrong word at times. She reports some difficulty with concentration. She reports that she has to break tasks up into different steps. She reports that she had some concentration difficulties prior to starting Sertraline. Denies SI.   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 She reports that her pay will be higher with guaranteed hours. Her husband is going to stay in [place].   Continues to see therapist.   Past Psychiatric Medication Trials: Trileptal Prozac- Tolerated at 10 mg po qd. More anxious at 20 mg. Took for awhile. wellbutrin Sr-adverse reaction Sertraline Rexulti Abilify- helped initially and then had concentration issues Latuda- Akathisia Klonopin   GAD-7    Flowsheet Row Office Visit from 05/08/2018 in [place]  Total GAD-7 Score 0      PHQ2-9     [facility] Visit from 05/08/2018 in [place]  PHQ-2 Total Score 1  PHQ-9 Total Score 1      [facility] from 09/07/2020 in [place] No Risk        Review of Systems:  Review of Systems  Musculoskeletal:  Positive for back pain and neck pain. Negative for gait problem.  Psychiatric/Behavioral:         Please refer to HPI   Medications: I have reviewed the patient's current medications.  Current Outpatient Medications  Medication Sig Dispense Refill   Biotin w/ Vitamins C & E (HAIR/SKIN/NAILS PO) Take by mouth.     Calcium Carbonate (CALCIUM 600 PO) Take by mouth 2 (two) times daily.     cyanocobalamin 1000 MCG tablet Take 1,000 mcg by mouth daily.     Inositol-D Chiro-Inositol 2000-50 MG PACK Take by mouth. Taking for PCOS.     levonorgestrel (MIRENA) 20 MCG/24HR IUD 1 each by Intrauterine route once.     Omega-3 Fatty Acids (FISH OIL) 1360 MG CAPS Take by mouth.     Oxcarbazepine (TRILEPTAL) 300 MG tablet Take 1.5 tablets (450 mg total) by mouth at bedtime. 135 tablet 1   Prenatal Vit-Fe Fumarate-FA (PRENATAL PO) Take by mouth.     Pyridoxine HCl (VITAMIN B6) 200 MG TABS Take by mouth.     sertraline (ZOLOFT) 50 MG tablet Take 1 tablet (50 mg  total) by mouth daily. 90 tablet 1   vitamin C (ASCORBIC ACID) 500 MG tablet Take 500 mg by mouth daily.     clonazePAM (KLONOPIN) 0.5 MG tablet TAKE 1 TO 2 TABLETS BY MOUTH AT BEDTIME AS NEEDED (Patient not taking: Reported on 10/01/2020) 60 tablet 2   No current facility-administered medications for this visit.    Medication Side Effects: None  Allergies:  Allergies  Allergen Reactions   Augmentin [Amoxicillin-Pot Clavulanate] Other (See Comments)    unknown   Ceclor [Cefaclor] Other (See Comments)    unknown   Other Other (See Comments)    IBU - s/p bariatric surgery    Past Medical History:  Diagnosis Date   Chicken pox     Depression with anxiety    GERD (gastroesophageal reflux disease)    IUD (intrauterine device) in place    PCOS (polycystic ovarian syndrome)     Past Medical History, Surgical history, Social history, and Family history were reviewed and updated as appropriate.   Please see review of systems for further details on the patient's review from today.   Objective:   Physical Exam:  There were no vitals taken for this visit.  Physical Exam Constitutional:      General: She is not in acute distress. Musculoskeletal:        General: No deformity.  Neurological:     Mental Status: She is alert and oriented to person, place, and time.     Coordination: Coordination normal.  Psychiatric:        Attention and Perception: Attention and perception normal. She does not perceive auditory or visual hallucinations.        Mood and Affect: Mood normal. Mood is not anxious or depressed. Affect is not labile, blunt, angry or inappropriate.        Speech: Speech normal.        Behavior: Behavior normal.        Thought Content: Thought content normal. Thought content is not paranoid or delusional. Thought content does not include homicidal or suicidal ideation. Thought content does not include homicidal or suicidal plan.        Cognition and Memory: Cognition and memory normal.        Judgment: Judgment normal.     Comments: Insight intact    Lab Review:     Component Value Date/Time   NA 138 09/07/2020 2343   NA 136 (A) 01/25/2017 0000   K 3.4 (L) 09/07/2020 2343   CL 103 09/07/2020 2343   CO2 27 09/07/2020 2343   GLUCOSE 84 09/07/2020 2343   BUN 13 09/07/2020 2343   CREATININE 0.75 09/07/2020 2343   CALCIUM 8.9 09/07/2020 2343   AST 23 01/25/2017 0000   ALT 24 01/25/2017 0000   GFRNONAA >60 09/07/2020 2343   GFRAA >60 11/20/2014 0041       Component Value Date/Time   WBC 10.8 (H) 09/07/2020 2343   RBC 4.37 09/07/2020 2343   HGB 13.0 09/07/2020 2343   HCT 39.2 09/07/2020 2343    PLT 278 09/07/2020 2343   MCV 89.7 09/07/2020 2343   MCH 29.7 09/07/2020 2343   MCHC 33.2 09/07/2020 2343   RDW 12.3 09/07/2020 2343   LYMPHSABS 4.0 09/07/2020 2343   MONOABS 0.9 09/07/2020 2343   EOSABS 0.2 09/07/2020 2343   BASOSABS 0.1 09/07/2020 2343    No results found for: POCLITH, LITHIUM   No results found for: PHENYTOIN, PHENOBARB, VALPROATE, CBMZ   .res Assessment: Plan:  Pt seen for 30 minutes and time spent counseling pt regarding continuation of medication while she is on travel assignment in [place]. Contacted pharmacy at time of visit and they confirmed that she could fill Sertraline and Oxcarbazepine early since she has been paying for medication with discount card instead of through insurance.  Discussed that Sertraline can sometimes cause cognitive side effects, typically at higher doses. She reports that occasional word finding errors were occurring before starting Sertraline. Discussed that Trileptal can also potentially cause cognitive side effects, however she has taken this dose long term and not had cognitive issues in the past.  Bridget Rich reports that she would like to continue current medications without changes since she reports benefits are outweighing any possible side effects.  Will continue Sertraline 50 mg po qd for anxiety and depression.  Continue Trileptal 450 mg po QHS for mood and anxiety.  Will provide script for Klonopin prn for pt to have available while on travel assignment in [place].  Pt to follow-up in 4 months or sooner if clinically indicated.  Patient advised to contact office with any questions, adverse effects, or acute worsening in signs and symptoms.  Bridget Rich was seen today for follow-up.  Diagnoses and all orders for this visit:  Anxiety disorder, unspecified type  Episodic mood disorder ([place])  Insomnia secondary to anxiety    Please see After Visit Summary for patient specific instructions.  Future Appointments  Date Time  Provider [place]  01/26/2021  1:50 PM Metta Clines R, DO [id number] None    No orders of the defined types were placed in this encounter.   -------------------------------

## 2020-11-05 ENCOUNTER — Ambulatory Visit: Payer: Commercial Managed Care - PPO | Admitting: Neurology

## 2020-11-06 ENCOUNTER — Ambulatory Visit: Payer: 59 | Admitting: Psychiatry

## 2020-12-12 ENCOUNTER — Other Ambulatory Visit: Payer: Self-pay | Admitting: Psychiatry

## 2020-12-12 DIAGNOSIS — F5105 Insomnia due to other mental disorder: Secondary | ICD-10-CM

## 2020-12-12 DIAGNOSIS — F39 Unspecified mood [affective] disorder: Secondary | ICD-10-CM

## 2020-12-12 DIAGNOSIS — F419 Anxiety disorder, unspecified: Secondary | ICD-10-CM

## 2021-01-25 ENCOUNTER — Ambulatory Visit: Payer: 59 | Admitting: Psychiatry

## 2021-01-25 NOTE — Progress Notes (Deleted)
NEUROLOGY CONSULTATION NOTE  HONESTI Rich MRN: [id number] DOB: [birth year]  Referring provider: Irene Pap, DO Primary care provider: Irene Pap, DO  Reason for consult:  headache, dysphagia, memory deficits  Assessment/Plan:   ***   Subjective:  Bridget Rich is a 35 year old female with PCOS who presents for headaches, dysphagia and memory deficits.  History supplemented by referring provider's note.  Headaches.  ***  Dysphagia:  ***.  Modified barium swallow on 09/25/2020 was normal.    Memory deficits:  ***  History of bariatric surgery.  Takes supplemental B12.  ***  CTA head and neck on 09/08/2020 personally reviewed was unremarkable without evidence of cerebral aneurysm, arterial dissection or other vascular abnormality.  Current NSAIDS/analgesics:  *** Current triptans:  none Current ergotamine:  none Current anti-emetic:  none Current muscle relaxants:  none Current Antihypertensive medications:  none Current Antidepressant medications:  sertraline 50mg  QD Current Anticonvulsant medications:  oxcarbazepine 450mg  QHS (for mood) Current anti-CGRP:  none Current Vitamins/Herbal/Supplements:  B12 1031mcg daily, D, Ca, C, E, B6, prenatal Current Antihistamines/Decongestants:  none Other therapy:  *** Hormone/birth control:  Mirena  Past NSAIDS/analgesics:  *** Past abortive triptans:  *** Past abortive ergotamine:  none Past muscle relaxants:  none Past anti-emetic:  none Past antihypertensive medications:  none Past antidepressant medications:  fluoxetine Past anticonvulsant medications:  *** Past anti-CGRP:  none Past vitamins/Herbal/Supplements:  none Past antihistamines/decongestants:  none Other past therapies:  ***  Caffeine:  *** Alcohol:  *** Smoker:  *** Diet:  *** Exercise:  *** Depression:  ***; Anxiety:  *** Other pain:  *** Sleep hygiene:  *** Family history of headache:  ***      PAST MEDICAL HISTORY: Past Medical  History:  Diagnosis Date   Chicken pox    Depression with anxiety    GERD (gastroesophageal reflux disease)    IUD (intrauterine device) in place    PCOS (polycystic ovarian syndrome)     PAST SURGICAL HISTORY: Past Surgical History:  Procedure Laterality Date   BARIATRIC SURGERY  12/2016   MOUTH SURGERY  2016    MEDICATIONS: Current Outpatient Medications on File Prior to Visit  Medication Sig Dispense Refill   Biotin w/ Vitamins C & E (HAIR/SKIN/NAILS PO) Take by mouth.     Calcium Carbonate (CALCIUM 600 PO) Take by mouth 2 (two) times daily.     clonazePAM (KLONOPIN) 0.5 MG tablet TAKE 1 TO 2 TABLETS BY MOUTH AT BEDTIME AS NEEDED (Patient not taking: Reported on 10/01/2020) 60 tablet 2   cyanocobalamin 1000 MCG tablet Take 1,000 mcg by mouth daily.     Inositol-D Chiro-Inositol 2000-50 MG PACK Take by mouth. Taking for PCOS.     levonorgestrel (MIRENA) 20 MCG/24HR IUD 1 each by Intrauterine route once.     Omega-3 Fatty Acids (FISH OIL) 1360 MG CAPS Take by mouth.     Oxcarbazepine (TRILEPTAL) 300 MG tablet TAKE 1.5 TABLETS (450 MG TOTAL) BY MOUTH AT BEDTIME. 135 tablet 0   Prenatal Vit-Fe Fumarate-FA (PRENATAL PO) Take by mouth.     Pyridoxine HCl (VITAMIN B6) 200 MG TABS Take by mouth.     sertraline (ZOLOFT) 50 MG tablet TAKE 1 TABLET BY MOUTH EVERY DAY 90 tablet 0   vitamin C (ASCORBIC ACID) 500 MG tablet Take 500 mg by mouth daily.     No current facility-administered medications on file prior to visit.    ALLERGIES: Allergies  Allergen Reactions   Augmentin [Amoxicillin-Pot Clavulanate] Other (See  Comments)    unknown   Ceclor [Cefaclor] Other (See Comments)    unknown   Other Other (See Comments)    IBU - s/p bariatric surgery    FAMILY HISTORY: Family History  Problem Relation Age of Onset   Diabetes Father    Depression Father    Hypertension Father    Hyperlipidemia Father    Mental illness Father    Anxiety disorder Sister    Depression Maternal  Grandfather    Diabetes Maternal Grandfather    Hearing loss Maternal Grandfather    Heart disease Maternal Grandfather    Kidney disease Maternal Grandfather    Mental illness Maternal Grandfather    Depression Maternal Grandmother    Diabetes Maternal Grandmother    COPD Paternal Grandfather    Alcohol abuse Paternal Grandfather    Asthma Paternal Grandfather    Diabetes Paternal Grandfather    Heart disease Paternal Grandfather    Hyperlipidemia Paternal Grandfather    Hypertension Paternal Grandfather    Bipolar disorder Cousin    Anxiety disorder Sister    Hypertension Mother    Lung cancer Paternal Grandmother    Depression Paternal Grandmother     Objective:  *** General: No acute distress.  Patient appears well-groomed.   Head:  Normocephalic/atraumatic Eyes:  fundi examined but not visualized Neck: supple, no paraspinal tenderness, full range of motion Back: No paraspinal tenderness Heart: regular rate and rhythm Lungs: Clear to auscultation bilaterally. Vascular: No carotid bruits. Neurological Exam: Mental status: alert and oriented to person, place, and time, recent and remote memory intact, fund of knowledge intact, attention and concentration intact, speech fluent and not dysarthric, language intact. Cranial nerves: CN I: not tested CN II: pupils equal, round and reactive to light, visual fields intact CN III, IV, VI:  full range of motion, no nystagmus, no ptosis CN V: facial sensation intact. CN VII: upper and lower face symmetric CN VIII: hearing intact CN IX, X: gag intact, uvula midline CN XI: sternocleidomastoid and trapezius muscles intact CN XII: tongue midline Bulk & Tone: normal, no fasciculations. Motor:  muscle strength 5/5 throughout Sensation:  Pinprick, temperature and vibratory sensation intact. Deep Tendon Reflexes:  2+ throughout,  toes downgoing.   Finger to nose testing:  Without dysmetria.   Heel to shin:  Without dysmetria.   Gait:   Normal station and stride.  Romberg negative.    Thank you for allowing me to take part in the care of this patient.  Metta Clines, DO  CC: ***

## 2021-01-26 ENCOUNTER — Ambulatory Visit: Payer: Managed Care, Other (non HMO) | Admitting: Neurology

## 2021-01-29 ENCOUNTER — Telehealth: Payer: BLUE CROSS/BLUE SHIELD | Admitting: Psychiatry

## 2021-02-02 ENCOUNTER — Encounter: Payer: Self-pay | Admitting: Psychiatry

## 2021-02-02 ENCOUNTER — Ambulatory Visit (INDEPENDENT_AMBULATORY_CARE_PROVIDER_SITE_OTHER): Payer: BC Managed Care – PPO | Admitting: Psychiatry

## 2021-02-02 ENCOUNTER — Other Ambulatory Visit: Payer: Self-pay

## 2021-02-02 DIAGNOSIS — F419 Anxiety disorder, unspecified: Secondary | ICD-10-CM

## 2021-02-02 DIAGNOSIS — F39 Unspecified mood [affective] disorder: Secondary | ICD-10-CM

## 2021-02-02 DIAGNOSIS — F5105 Insomnia due to other mental disorder: Secondary | ICD-10-CM

## 2021-02-02 MED ORDER — SERTRALINE HCL 50 MG PO TABS: 50.0000 mg | ORAL_TABLET | Freq: Every day | ORAL | 0 refills | Status: DC

## 2021-02-02 MED ORDER — OXCARBAZEPINE 300 MG PO TABS: 450.0000 mg | ORAL_TABLET | Freq: Every day | ORAL | 0 refills | Status: DC

## 2021-02-02 MED ORDER — CLONAZEPAM 0.5 MG PO TABS: ORAL_TABLET | ORAL | 0 refills | Status: DC

## 2021-02-02 NOTE — Progress Notes (Signed)
Bridget Rich [id number] [birth year] 35 y.o.  Subjective:   Patient ID:  Bridget Rich is a 35 y.o. (DOB 1985/06/05) female.  Chief Complaint:  Chief Complaint  Patient presents with   Other    Impulsive behavior   Follow-up    Anxiety, depression    HPI Bridget Rich presents to the office today for follow-up of anxiety and mood disturbance. 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 Thinking of staying in [place] for at least another 6 months. Has been home for 1.5 weeks.   She noticed she was spending money at times to include shopping and going to games. She reports that she has been drinking some and would like to stop this. She reports some other impulsive behaviors. Long-standing h/o over-eating. She reports that she tries to "avoid being alone with my thoughts." She reports appetite has been stable. Reports decreased appetite since starting Mounjaro. She reports that some foods don't taste as good as before.   She reports adequate sleep. Has not been having trouble getting out of bed. Energy has been adequate. Motivation has been adequate. Denies excessive energy or increased goal-directed behavior. Denies SI.   Occ anxiety. She reports poor time management and has frequently been significantly running late for social events.  Has not taken Klonopin other than on flight.   She is starting group and individual DBT next week in [place].   Past Psychiatric Medication Trials: Trileptal Prozac- Tolerated at 10 mg po qd. More anxious at 20 mg. Took for awhile. wellbutrin Sr-adverse reaction Sertraline Rexulti Abilify- helped initially and then had concentration issues Latuda- Akathisia Klonopin  GAD-7    Flowsheet Row Office Visit from 05/08/2018 in [facility]  Total GAD-7 Score 0      PHQ2-9    [facility] Office Visit from 05/08/2018 in [facility]  PHQ-2 Total Score 1  PHQ-9  Total Score 1      [facility] ED from 09/07/2020 in [facility] No Risk        Review of Systems:  Review of Systems  Musculoskeletal:  Negative for gait problem.  Neurological:  Negative for tremors.  Psychiatric/Behavioral:         Please refer to HPI   Medications: I have reviewed the patient's current medications.  Current Outpatient Medications  Medication Sig Dispense Refill   Biotin w/ Vitamins C & E (HAIR/SKIN/NAILS PO) Take by mouth.     Calcium Carbonate (CALCIUM 600 PO) Take by mouth 2 (two) times daily.     cyanocobalamin 1000 MCG tablet Take 1,000 mcg by mouth daily.     Inositol-D Chiro-Inositol 2000-50 MG PACK Take by mouth. Taking for PCOS.     levonorgestrel (MIRENA) 20 MCG/24HR IUD 1 each by Intrauterine route once.     MOUNJARO 5 MG/0.5ML Pen SMARTSIG:5 Milligram(s) SUB-Q Once a Week     Omega-3 Fatty Acids (FISH OIL) 1360 MG CAPS Take by mouth.     Prenatal Vit-Fe Fumarate-FA (PRENATAL PO) Take by mouth.     Pyridoxine HCl (VITAMIN B6) 200 MG TABS Take by mouth.     vitamin C (ASCORBIC ACID) 500 MG tablet Take 500 mg by mouth daily.     clonazePAM (KLONOPIN) 0.5 MG tablet TAKE 1 TO 2 TABLETS BY MOUTH AT BEDTIME AS NEEDED 60 tablet 0   Oxcarbazepine (TRILEPTAL) 300 MG tablet Take 1.5 tablets (  450 mg total) by mouth at bedtime. 135 tablet 0   sertraline (ZOLOFT) 50 MG tablet Take 1 tablet (50 mg total) by mouth daily. 90 tablet 0   No current facility-administered medications for this visit.    Medication Side Effects: None  Allergies:  Allergies  Allergen Reactions   Augmentin [Amoxicillin-Pot Clavulanate] Other (See Comments)    unknown   Ceclor [Cefaclor] Other (See Comments)    unknown   Other Other (See Comments)    IBU - s/p bariatric surgery    Past Medical History:  Diagnosis Date   Chicken pox    Depression with anxiety    GERD (gastroesophageal reflux disease)    IUD (intrauterine  device) in place    PCOS (polycystic ovarian syndrome)     Past Medical History, Surgical history, Social history, and Family history were reviewed and updated as appropriate.   Please see review of systems for further details on the patient's review from today.   Objective:   Physical Exam:  There were no vitals taken for this visit.  Physical Exam Constitutional:      General: She is not in acute distress. Musculoskeletal:        General: No deformity.  Neurological:     Mental Status: She is alert and oriented to person, place, and time.     Coordination: Coordination normal.  Psychiatric:        Attention and Perception: Attention and perception normal. She does not perceive auditory or visual hallucinations.        Mood and Affect: Mood normal. Mood is not anxious or depressed. Affect is not labile, blunt, angry or inappropriate.        Speech: Speech normal.        Behavior: Behavior normal.        Thought Content: Thought content normal. Thought content is not paranoid or delusional. Thought content does not include homicidal or suicidal ideation. Thought content does not include homicidal or suicidal plan.        Cognition and Memory: Cognition and memory normal.        Judgment: Judgment normal.     Comments: Insight intact    Lab Review:     Component Value Date/Time   NA 138 09/07/2020 2343   NA 136 (A) 01/25/2017 0000   K 3.4 (L) 09/07/2020 2343   CL 103 09/07/2020 2343   CO2 27 09/07/2020 2343   GLUCOSE 84 09/07/2020 2343   BUN 13 09/07/2020 2343   CREATININE 0.75 09/07/2020 2343   CALCIUM 8.9 09/07/2020 2343   AST 23 01/25/2017 0000   ALT 24 01/25/2017 0000   GFRNONAA >60 09/07/2020 2343   GFRAA >60 11/20/2014 0041       Component Value Date/Time   WBC 10.8 (H) 09/07/2020 2343   RBC 4.37 09/07/2020 2343   HGB 13.0 09/07/2020 2343   HCT 39.2 09/07/2020 2343   PLT 278 09/07/2020 2343   MCV 89.7 09/07/2020 2343   MCH 29.7 09/07/2020 2343   MCHC  33.2 09/07/2020 2343   RDW 12.3 09/07/2020 2343   LYMPHSABS 4.0 09/07/2020 2343   MONOABS 0.9 09/07/2020 2343   EOSABS 0.2 09/07/2020 2343   BASOSABS 0.1 09/07/2020 2343    No results found for: POCLITH, LITHIUM   No results found for: PHENYTOIN, PHENOBARB, VALPROATE, CBMZ   .res Assessment: Plan:   Patient seen for 30 minutes and time spent discussing impulsive behavior.  Discussed that impulsivity typically is chronic and does  not change with changes in mood and typically does not occur with other possible manic signs and symptoms, and therefore suspicion for bipolar is low.  Agree with plan to start individual and group DBT since pt has notice impulsivity seems to be in response to avoiding uncomfortable emotions and thoughts and/or being alone.  Discussed continuing to closely monitor mood and impulsive behavior. Continue sertraline 50 mg daily for mood and anxiety. Continue Trileptal 750 mg at bedtime for anxiety, insomnia and impulsivity. Patient reports rarely taking Klonopin other than traveling by airplane.  Will provide 1 prescription of Klonopin for to have available since she is periodically traveling to and from work assignment in [place].  Pt to follow-up in 6 months or sooner if clinically indicated.  Patient advised to contact office with any questions, adverse effects, or acute worsening in signs and symptoms.   Bridget Rich was seen today for other and follow-up.  Diagnoses and all orders for this visit:  Anxiety disorder, unspecified type -     sertraline (ZOLOFT) 50 MG tablet; Take 1 tablet (50 mg total) by mouth daily. -     Oxcarbazepine (TRILEPTAL) 300 MG tablet; Take 1.5 tablets (450 mg total) by mouth at bedtime. -     clonazePAM (KLONOPIN) 0.5 MG tablet; TAKE 1 TO 2 TABLETS BY MOUTH AT BEDTIME AS NEEDED  Episodic mood disorder (HCC) -     sertraline (ZOLOFT) 50 MG tablet; Take 1 tablet (50 mg total) by mouth daily.  Insomnia secondary to anxiety -      Oxcarbazepine (TRILEPTAL) 300 MG tablet; Take 1.5 tablets (450 mg total) by mouth at bedtime. -     clonazePAM (KLONOPIN) 0.5 MG tablet; TAKE 1 TO 2 TABLETS BY MOUTH AT BEDTIME AS NEEDED    Please see After Visit Summary for patient specific instructions.  Future Appointments  Date Time Provider [place]  08/16/2021 11:00 AM Thayer Headings, PMHNP CP-CP None    No orders of the defined types were placed in this encounter.   -------------------------------

## 2021-04-23 ENCOUNTER — Other Ambulatory Visit: Payer: Self-pay | Admitting: Psychiatry

## 2021-04-23 DIAGNOSIS — F39 Unspecified mood [affective] disorder: Secondary | ICD-10-CM

## 2021-04-23 DIAGNOSIS — F419 Anxiety disorder, unspecified: Secondary | ICD-10-CM

## 2021-08-02 ENCOUNTER — Other Ambulatory Visit: Payer: Self-pay | Admitting: Psychiatry

## 2021-08-02 DIAGNOSIS — F39 Unspecified mood [affective] disorder: Secondary | ICD-10-CM

## 2021-08-02 DIAGNOSIS — F419 Anxiety disorder, unspecified: Secondary | ICD-10-CM

## 2021-08-03 NOTE — Telephone Encounter (Signed)
LVM to RC to verify if pharmacy is correct.  ?

## 2021-08-04 ENCOUNTER — Other Ambulatory Visit: Payer: Self-pay

## 2021-08-04 DIAGNOSIS — F419 Anxiety disorder, unspecified: Secondary | ICD-10-CM

## 2021-08-04 DIAGNOSIS — F39 Unspecified mood [affective] disorder: Secondary | ICD-10-CM

## 2021-08-04 MED ORDER — OXCARBAZEPINE 300 MG PO TABS: 450.0000 mg | ORAL_TABLET | Freq: Every day | ORAL | 0 refills | Status: DC

## 2021-08-04 MED ORDER — SERTRALINE HCL 50 MG PO TABS: 50.0000 mg | ORAL_TABLET | Freq: Every day | ORAL | 0 refills | Status: DC

## 2021-08-04 NOTE — Telephone Encounter (Signed)
Called patient again to verify pharmacy. She is currently working as a travel Marine scientist in [place], but will be back in [place] soon for an appt with Janett Billow. She said she has enough medication and to [facility] refill [facility] [place].  ?

## 2021-08-16 ENCOUNTER — Encounter: Payer: Self-pay | Admitting: Psychiatry

## 2021-08-16 ENCOUNTER — Ambulatory Visit (INDEPENDENT_AMBULATORY_CARE_PROVIDER_SITE_OTHER): Payer: BC Managed Care – PPO | Admitting: Psychiatry

## 2021-08-16 DIAGNOSIS — F419 Anxiety disorder, unspecified: Secondary | ICD-10-CM

## 2021-08-16 DIAGNOSIS — F5105 Insomnia due to other mental disorder: Secondary | ICD-10-CM | POA: Diagnosis not present

## 2021-08-16 DIAGNOSIS — F39 Unspecified mood [affective] disorder: Secondary | ICD-10-CM

## 2021-08-16 MED ORDER — BUPROPION HCL ER (XL) 150 MG PO TB24: 150.0000 mg | ORAL_TABLET | Freq: Every day | ORAL | 0 refills | Status: DC

## 2021-08-16 NOTE — Progress Notes (Signed)
Antonya Renold Don ?[id number] ?Aug 30, 1985 ?36 y.o. ? ?Virtual Visit via Telephone Note ? ?I connected with pt on 08/16/21 at 11:00 AM EDT by telephone and verified that I am speaking with the correct person using two identifiers. ?  ?I discussed the limitations, risks, security and privacy concerns of performing an evaluation and management service by telephone and the availability of in person appointments. I also discussed with the patient that there may be a patient responsible charge related to this service. The patient expressed understanding and agreed to proceed. ?  ?I discussed the assessment and treatment plan with the patient. The patient was provided an opportunity to ask questions and all were answered. The patient agreed with the plan and demonstrated an understanding of the instructions. ?  ?The patient was advised to call back or seek an in-person evaluation if the symptoms worsen or if the condition fails to improve as anticipated. ? ?I provided 35 minutes of non-face-to-face time during this encounter.  The patient was located in [place].  The provider was located at home in [place]. ? ? ?Thayer Headings, PMHNP ? ? ?Subjective:  ? ?Patient ID:  Bridget Rich is a 36 y.o. (DOB [birth year]) female. ? ?Chief Complaint:  ?Chief Complaint  ?Patient presents with  ? Other  ?  Low motivation, difficulty with concentration, craving sweets  ? ? ?HPI ?Bridget Rich presents for follow-up of anxiety and mood disturbance. She has been doing individual and group DBT and has found this very helpful with mood and emotion regulation. Has not been impulsively shopping, drinking, or other impulsive behaviors.  ? ?She has been having difficulty focusing and completing tasks. 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 She reports that her last assignment was 30 hours or less and now is working over 40 hours and is driving more between patients' homes. She reports that she has been  able to manage time in the past. She reports that her motivation has been low for work. Motivation is ok for cleaning and other tasks. Energy has been low. She denies depression and reports some situational sadness. Anxiety has been "ok. I still have it but I think I am managing it better." She reports taking regular walks and using deep breathing. She reports that she has been using Klonopin only when flying. She reports that she has been eating more than she would like and is craving sweets "all the time." Reports that she has gained 15-20 lbs with Ozempic. Denies SI. ? ?She reports that she has been having difficulty with sleep and that it could be related to her mattress. Sleep has improved some with different mattress topper. She has had some difficulty getting to sleep and staying asleep.  ? ?Grandfather passed away unexpectedly. Aunt called her to ask about grandfather's DNR while CPR was being performed. Aunt did not want to call her mother and pt had to call mother to tell her about grandfather's condition. ? ?She and her husband were separated for a period. She and her husband are working towards reconciliation.  ? ?Assignment ends in July. She plans to return to [facility] in late July.  ? ?Has been in current position about a month.  ? ?Past Psychiatric Medication Trials: ?Trileptal ?Prozac- Tolerated at 10 mg po qd. More anxious at 20 mg. Took for awhile. ?wellbutrin ?Sertraline ?Rexulti ?Abilify- helped initially and then had concentration issues ?Latuda- Akathisia ?Klonopin ? ?Review of Systems:  ?Review of  Systems  ?Cardiovascular:  Negative for palpitations.  ?Musculoskeletal:  Negative for gait problem.  ?Neurological:  Negative for tremors.  ?Psychiatric/Behavioral:    ?     Please refer to HPI  ? ?Medications: I have reviewed the patient's current medications. ? ?Current Outpatient Medications  ?Medication Sig Dispense Refill  ? Biotin w/ Vitamins C & E (HAIR/SKIN/NAILS PO) Take by mouth.    ? buPROPion  (WELLBUTRIN XL) 150 MG 24 hr tablet Take 1 tablet (150 mg total) by mouth daily. 90 tablet 0  ? Calcium Carbonate (CALCIUM 600 PO) Take by mouth 2 (two) times daily.    ? clonazePAM (KLONOPIN) 0.5 MG tablet TAKE 1 TO 2 TABLETS BY MOUTH AT BEDTIME AS NEEDED 60 tablet 0  ? cyanocobalamin 1000 MCG tablet Take 1,000 mcg by mouth daily.    ? Inositol-D Chiro-Inositol 2000-50 MG PACK Take by mouth. Taking for PCOS.    ? levonorgestrel (MIRENA) 20 MCG/24HR IUD 1 each by Intrauterine route once.    ? Omega-3 Fatty Acids (FISH OIL) 1360 MG CAPS Take by mouth.    ? Oxcarbazepine (TRILEPTAL) 300 MG tablet Take 1.5 tablets (450 mg total) by mouth at bedtime. 135 tablet 0  ? OZEMPIC, 1 MG/DOSE, 4 MG/3ML SOPN Inject 1 mg into the skin once a week.    ? Prenatal Vit-Fe Fumarate-FA (PRENATAL PO) Take by mouth.    ? Pyridoxine HCl (VITAMIN B6) 200 MG TABS Take by mouth.    ? sertraline (ZOLOFT) 50 MG tablet Take 1 tablet (50 mg total) by mouth daily. 90 tablet 0  ? vitamin C (ASCORBIC ACID) 500 MG tablet Take 500 mg by mouth daily.    ? ?No current facility-administered medications for this visit.  ? ? ?Medication Side Effects: None ? ?Allergies:  ?Allergies  ?Allergen Reactions  ? Augmentin [Amoxicillin-Pot Clavulanate] Other (See Comments)  ?  unknown  ? Ceclor [Cefaclor] Other (See Comments)  ?  unknown  ? Other Other (See Comments)  ?  IBU - s/p bariatric surgery  ? ? ?Past Medical History:  ?Diagnosis Date  ? Chicken pox   ? Depression with anxiety   ? GERD (gastroesophageal reflux disease)   ? IUD (intrauterine device) in place   ? PCOS (polycystic ovarian syndrome)   ? ? ?Family History  ?Problem Relation Age of Onset  ? Diabetes Father   ? Depression Father   ? Hypertension Father   ? Hyperlipidemia Father   ? Mental illness Father   ? Anxiety disorder Sister   ? Depression Maternal Grandfather   ? Diabetes Maternal Grandfather   ? Hearing loss Maternal Grandfather   ? Heart disease Maternal Grandfather   ? Kidney disease  Maternal Grandfather   ? Mental illness Maternal Grandfather   ? Depression Maternal Grandmother   ? Diabetes Maternal Grandmother   ? COPD Paternal Grandfather   ? Alcohol abuse Paternal Grandfather   ? Asthma Paternal Grandfather   ? Diabetes Paternal Grandfather   ? Heart disease Paternal Grandfather   ? Hyperlipidemia Paternal Grandfather   ? Hypertension Paternal Grandfather   ? Bipolar disorder Cousin   ? Anxiety disorder Sister   ? Hypertension Mother   ? Lung cancer Paternal Grandmother   ? Depression Paternal Grandmother   ? ? ?Social History  ? ?Socioeconomic History  ? Marital status: Married  ?  Spouse name: Not on file  ? Number of children: Not on file  ? Years of education: Not on file  ?  Highest education level: Not on file  ?Occupational History  ? Not on file  ?Tobacco Use  ? Smoking status: Never  ? Smokeless tobacco: Never  ?Vaping Use  ? Vaping Use: Never used  ?Substance and Sexual Activity  ? Alcohol use: Yes  ?  Comment: Occasionally 6-10 over a month   ? Drug use: No  ? Sexual activity: Yes  ?  Partners: Male  ?  Comment: iud  ?Other Topics Concern  ? Not on file  ?Social History Narrative  ? Marital status/children/pets: single  ? Education/employment: Master's degree, Psychologist, clinical.   ? Safety:   ?   -Wears a bicycle helmet riding a bike: Yes  ?   -smoke alarm in the home:Yes  ?   - wears seatbelt: Yes  ?   - Feels safe in their relationships: Yes  ? ?Social Determinants of Health  ? ?Financial Resource Strain: Not on file  ?Food Insecurity: Not on file  ?Transportation Needs: Not on file  ?Physical Activity: Not on file  ?Stress: Not on file  ?Social Connections: Not on file  ?Intimate Partner Violence: Not on file  ? ? ?Past Medical History, Surgical history, Social history, and Family history were reviewed and updated as appropriate.  ? ?Please see review of systems for further details on the patient's review from today.  ? ?Objective:  ? ?Physical Exam:  ?There were no  vitals taken for this visit. ? ?Physical Exam ?Neurological:  ?   Mental Status: She is alert and oriented to person, place, and time.  ?   Cranial Nerves: No dysarthria.  ?Psychiatric:     ?   Attention a

## 2021-10-27 ENCOUNTER — Ambulatory Visit: Payer: Self-pay | Admitting: Podiatry

## 2021-10-27 ENCOUNTER — Encounter: Payer: Self-pay | Admitting: Psychiatry

## 2021-10-27 ENCOUNTER — Ambulatory Visit (INDEPENDENT_AMBULATORY_CARE_PROVIDER_SITE_OTHER): Payer: BC Managed Care – PPO | Admitting: Psychiatry

## 2021-10-27 DIAGNOSIS — F419 Anxiety disorder, unspecified: Secondary | ICD-10-CM

## 2021-10-27 DIAGNOSIS — F5105 Insomnia due to other mental disorder: Secondary | ICD-10-CM | POA: Diagnosis not present

## 2021-10-27 DIAGNOSIS — F39 Unspecified mood [affective] disorder: Secondary | ICD-10-CM | POA: Diagnosis not present

## 2021-10-27 MED ORDER — SERTRALINE HCL 50 MG PO TABS: 50.0000 mg | ORAL_TABLET | Freq: Every day | ORAL | 0 refills | Status: DC

## 2021-10-27 MED ORDER — BUPROPION HCL ER (XL) 150 MG PO TB24: 150.0000 mg | ORAL_TABLET | Freq: Every day | ORAL | 0 refills | Status: DC

## 2021-10-27 MED ORDER — OXCARBAZEPINE 300 MG PO TABS: ORAL_TABLET | ORAL | 1 refills | Status: DC

## 2021-10-27 NOTE — Progress Notes (Signed)
Bridget Rich [id number] 12/12/1985 36 y.o.  Virtual Visit via Telephone Note  I connected with pt on 10/27/21 at 12:30 PM EDT by telephone and verified that I am speaking with the correct person using two identifiers.   I discussed the limitations, risks, security and privacy concerns of performing an evaluation and management service by telephone and the availability of in person appointments. I also discussed with the patient that there may be a patient responsible charge related to this service. The patient expressed understanding and agreed to proceed.   I discussed the assessment and treatment plan with the patient. The patient was provided an opportunity to ask questions and all were answered. The patient agreed with the plan and demonstrated an understanding of the instructions.   The patient was advised to call back or seek an in-person evaluation if the symptoms worsen or if the condition fails to improve as anticipated.  I provided 15 minutes of non-face-to-face time during this encounter.  The patient was located in [place].  The provider was located at [place].   Thayer Headings, PMHNP   Subjective:   Patient ID:  Bridget Rich is a 36 y.o. (DOB March 12, 1986) female.  Chief Complaint:  Chief Complaint  Patient presents with   Follow-up    Anxiety, mood disturbance, difficulty with concentration    HPI Bridget Rich presents for follow-up of anxiety, mood disturbance, and insomnia. She reports that she has been more productive since starting Wellbutrin and her focus has improved. She has not seen any changes with her eating or appetite. She has been wanting to eat sweets. Improved mood. Energy and motivation have been better. Anxiety has decreased and that this may be related DBT skills. She reports that frustration has been less. Denies SI.   She has not been sleeping well. Just moved back from [place] and was sleeping in different beds and traveling.  Difficulty falling and staying asleep. No nightmares or restless legs. Unaware if she snores. She reports that she would like to see if this improves with being in one place.   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 She and her husband started therapy this morning. They are in the processing of reconciling. She has found a therapist at [facility].   Reports that she has not taken Klonopin since May.   Past Psychiatric Medication Trials: Trileptal Prozac- Tolerated at 10 mg po qd. More anxious at 20 mg. Took for awhile. wellbutrin Sertraline Rexulti Abilify- helped initially and then had concentration issues Latuda- Akathisia Klonopin  Review of Systems:  Review of Systems  Musculoskeletal:  Negative for gait problem.  Neurological:  Negative for tremors and headaches.  Psychiatric/Behavioral:         Please refer to HPI    Medications: I have reviewed the patient's current medications.  Current Outpatient Medications  Medication Sig Dispense Refill   Biotin w/ Vitamins C & E (HAIR/SKIN/NAILS PO) Take by mouth.     Calcium Carbonate (CALCIUM 600 PO) Take by mouth 2 (two) times daily.     cyanocobalamin 1000 MCG tablet Take 1,000 mcg by mouth daily.     Inositol-D Chiro-Inositol 2000-50 MG PACK Take by mouth. Taking for PCOS.     levonorgestrel (MIRENA) 20 MCG/24HR IUD 1 each by Intrauterine route once.     Omega-3 Fatty Acids (FISH OIL) 1360 MG CAPS Take by mouth.     Prenatal Vit-Fe Fumarate-FA (PRENATAL PO) Take by mouth.  Pyridoxine HCl (VITAMIN B6) 200 MG TABS Take by mouth.     vitamin C (ASCORBIC ACID) 500 MG tablet Take 500 mg by mouth daily.     buPROPion (WELLBUTRIN XL) 150 MG 24 hr tablet Take 1 tablet (150 mg total) by mouth daily. 90 tablet 0   clonazePAM (KLONOPIN) 0.5 MG tablet TAKE 1 TO 2 TABLETS BY MOUTH AT BEDTIME AS NEEDED 60 tablet 0   Oxcarbazepine (TRILEPTAL) 300 MG tablet Take 1-1.5 tabs po QHS 135 tablet 1   OZEMPIC, 1 MG/DOSE, 4  MG/3ML SOPN Inject 1 mg into the skin once a week. (Patient not taking: Reported on 10/27/2021)     sertraline (ZOLOFT) 50 MG tablet Take 1 tablet (50 mg total) by mouth daily. 90 tablet 0   No current facility-administered medications for this visit.    Medication Side Effects: None  Allergies:  Allergies  Allergen Reactions   Augmentin [Amoxicillin-Pot Clavulanate] Other (See Comments)    unknown   Ceclor [Cefaclor] Other (See Comments)    unknown   Other Other (See Comments)    IBU - s/p bariatric surgery    Past Medical History:  Diagnosis Date   Chicken pox    Depression with anxiety    GERD (gastroesophageal reflux disease)    IUD (intrauterine device) in place    PCOS (polycystic ovarian syndrome)     Family History  Problem Relation Age of Onset   Diabetes Father    Depression Father    Hypertension Father    Hyperlipidemia Father    Mental illness Father    Anxiety disorder Sister    Depression Maternal Grandfather    Diabetes Maternal Grandfather    Hearing loss Maternal Grandfather    Heart disease Maternal Grandfather    Kidney disease Maternal Grandfather    Mental illness Maternal Grandfather    Depression Maternal Grandmother    Diabetes Maternal Grandmother    COPD Paternal Grandfather    Alcohol abuse Paternal Grandfather    Asthma Paternal Grandfather    Diabetes Paternal Grandfather    Heart disease Paternal Grandfather    Hyperlipidemia Paternal Grandfather    Hypertension Paternal Grandfather    Bipolar disorder Cousin    Anxiety disorder Sister    Hypertension Mother    Lung cancer Paternal Grandmother    Depression Paternal Grandmother     Social History   Socioeconomic History   Marital status: Married    Spouse name: Not on file   Number of children: Not on file   Years of education: Not on file   Highest education level: Not on file  Occupational History   Not on file  Tobacco Use   Smoking status: Never   Smokeless  tobacco: Never  Vaping Use   Vaping Use: Never used  Substance and Sexual Activity   Alcohol use: Yes    Comment: Occasionally 6-10 over a month    Drug use: No   Sexual activity: Yes    Partners: Male    Comment: iud  Other Topics Concern   Not on file  Social History Narrative   Marital status/children/pets: single   Education/employment: Master's degree, Psychologist, clinical.    Safety:      -Wears a bicycle helmet riding a bike: Yes     -smoke alarm in the home:Yes     - wears seatbelt: Yes     - Feels safe in their relationships: Yes   Social Determinants of Health   Financial  Resource Strain: Not on file  Food Insecurity: Not on file  Transportation Needs: Not on file  Physical Activity: Not on file  Stress: Not on file  Social Connections: Not on file  Intimate Partner Violence: Not on file    Past Medical History, Surgical history, Social history, and Family history were reviewed and updated as appropriate.   Please see review of systems for further details on the patient's review from today.   Objective:   Physical Exam:  There were no vitals taken for this visit.  Physical Exam Neurological:     Mental Status: She is alert and oriented to person, place, and time.     Cranial Nerves: No dysarthria.  Psychiatric:        Attention and Perception: Attention and perception normal.        Mood and Affect: Mood normal.        Speech: Speech normal.        Behavior: Behavior is cooperative.        Thought Content: Thought content normal. Thought content is not paranoid or delusional. Thought content does not include homicidal or suicidal ideation. Thought content does not include homicidal or suicidal plan.        Cognition and Memory: Cognition and memory normal.        Judgment: Judgment normal.     Comments: Insight intact     Lab Review:     Component Value Date/Time   NA 138 09/07/2020 2343   NA 136 (A) 01/25/2017 0000   K 3.4 (L) 09/07/2020  2343   CL 103 09/07/2020 2343   CO2 27 09/07/2020 2343   GLUCOSE 84 09/07/2020 2343   BUN 13 09/07/2020 2343   CREATININE 0.75 09/07/2020 2343   CALCIUM 8.9 09/07/2020 2343   AST 23 01/25/2017 0000   ALT 24 01/25/2017 0000   GFRNONAA >60 09/07/2020 2343   GFRAA >60 11/20/2014 0041       Component Value Date/Time   WBC 10.8 (H) 09/07/2020 2343   RBC 4.37 09/07/2020 2343   HGB 13.0 09/07/2020 2343   HCT 39.2 09/07/2020 2343   PLT 278 09/07/2020 2343   MCV 89.7 09/07/2020 2343   MCH 29.7 09/07/2020 2343   MCHC 33.2 09/07/2020 2343   RDW 12.3 09/07/2020 2343   LYMPHSABS 4.0 09/07/2020 2343   MONOABS 0.9 09/07/2020 2343   EOSABS 0.2 09/07/2020 2343   BASOSABS 0.1 09/07/2020 2343    No results found for: "POCLITH", "LITHIUM"   No results found for: "PHENYTOIN", "PHENOBARB", "VALPROATE", "CBMZ"   .res Assessment: Plan:    Pt reports that she would like to decrease Trileptal if possible. Agree with attempt to reduce dose. Will write script for Trileptal 300 mg 1-1.5 tabs po QHS for her to attempt to lower dose to 300 mg and then have the ability to resume 450 mg dose if she experiences any worsening in mood, anxiety, or insomnia.  Continue Wellbutrin XL 150 mg po qd since this has been helpful for mood, concentration, energy, and motivation.  Continue Sertraline 50 mg po qd for anxiety and depression.  Continue Klonopin prn. She reports that she does not need a refill at this time.  Recommend continuing psychotherapy.  Pt to follow-up in 3 months or sooner if clinically indicated.  Patient advised to contact office with any questions, adverse effects, or acute worsening in signs and symptoms.   Iram was seen today for follow-up.  Diagnoses and all orders for this visit:  Anxiety disorder, unspecified type -     Oxcarbazepine (TRILEPTAL) 300 MG tablet; Take 1-1.5 tabs po QHS -     sertraline (ZOLOFT) 50 MG tablet; Take 1 tablet (50 mg total) by mouth daily.  Insomnia  secondary to anxiety -     Oxcarbazepine (TRILEPTAL) 300 MG tablet; Take 1-1.5 tabs po QHS  Episodic mood disorder (HCC) -     buPROPion (WELLBUTRIN XL) 150 MG 24 hr tablet; Take 1 tablet (150 mg total) by mouth daily. -     sertraline (ZOLOFT) 50 MG tablet; Take 1 tablet (50 mg total) by mouth daily.    Please see After Visit Summary for patient specific instructions.  No future appointments.  No orders of the defined types were placed in this encounter.     -------------------------------

## 2021-11-06 ENCOUNTER — Other Ambulatory Visit: Payer: Self-pay | Admitting: Psychiatry

## 2021-11-06 DIAGNOSIS — F419 Anxiety disorder, unspecified: Secondary | ICD-10-CM

## 2021-11-06 DIAGNOSIS — F39 Unspecified mood [affective] disorder: Secondary | ICD-10-CM

## 2021-11-12 ENCOUNTER — Other Ambulatory Visit: Payer: Self-pay | Admitting: Psychiatry

## 2021-11-12 DIAGNOSIS — F39 Unspecified mood [affective] disorder: Secondary | ICD-10-CM

## 2021-12-09 ENCOUNTER — Encounter: Payer: Self-pay | Admitting: Obstetrics and Gynecology

## 2021-12-09 ENCOUNTER — Ambulatory Visit: Payer: BC Managed Care – PPO | Admitting: Obstetrics and Gynecology

## 2021-12-09 ENCOUNTER — Encounter: Payer: BC Managed Care – PPO | Admitting: Obstetrics and Gynecology

## 2021-12-09 ENCOUNTER — Other Ambulatory Visit (HOSPITAL_COMMUNITY)
Admission: RE | Admit: 2021-12-09 | Discharge: 2021-12-09 | Disposition: A | Discharge status: Home / Self Care | Payer: BC Managed Care – PPO | Source: Ambulatory Visit | Attending: Obstetrics and Gynecology | Admitting: Obstetrics and Gynecology

## 2021-12-09 VITALS — BP 112/66 | HR 72 | Ht 67.0 in | Wt 228.0 lb

## 2021-12-09 DIAGNOSIS — Z30432 Encounter for removal of intrauterine contraceptive device: Secondary | ICD-10-CM

## 2021-12-09 DIAGNOSIS — E282 Polycystic ovarian syndrome: Secondary | ICD-10-CM

## 2021-12-09 DIAGNOSIS — Z01419 Encounter for gynecological examination (general) (routine) without abnormal findings: Secondary | ICD-10-CM | POA: Diagnosis not present

## 2021-12-09 DIAGNOSIS — Z9889 Other specified postprocedural states: Secondary | ICD-10-CM

## 2021-12-09 DIAGNOSIS — Z124 Encounter for screening for malignant neoplasm of cervix: Secondary | ICD-10-CM | POA: Diagnosis present

## 2021-12-09 DIAGNOSIS — N912 Amenorrhea, unspecified: Secondary | ICD-10-CM

## 2021-12-09 DIAGNOSIS — Z3009 Encounter for other general counseling and advice on contraception: Secondary | ICD-10-CM

## 2021-12-09 MED ORDER — MEDROXYPROGESTERONE ACETATE 5 MG PO TABS: ORAL_TABLET | ORAL | 3 refills | Status: AC

## 2021-12-09 NOTE — Progress Notes (Addendum)
36 y.o. G0P0000 Married White or Caucasian Not Hispanic or Latino female here to establish care. She had a mirena IUD placed in September of 2022. She would like it removed today.  No dyspareunia. No bowel or bladder c/o.   H/O PCOS, she has had an IUD in since she was 57 and wants to see how her body does without it. She is interested in pregnancy at some point, not ready right now.   H/O +ANA  HgbA1C from 10/27/21 was 5.3%  H/O gastric sleeve. Highest weight was 290, lowest weight was 212.   No FH of genetic or chromosomal abnormalities.     No LMP recorded. (Menstrual status: IUD).          Sexually active: Yes.    The current method of family planning is IUD.    Exercising: Yes.     Cardio and weights  Smoker:  no  Health Maintenance: Pap:  2022 WNL in [place]. 01/27/2017  History of abnormal Pap:  yes hx of colpo in 2020, no surgery on her cervix.   MMG:  none  BMD:   none  Colonoscopy: none  TDaP:  05/08/2018 Gardasil: none    reports that she has never smoked. She has never used smokeless tobacco. She reports current alcohol use. She reports that she does not use drugs. She is a travel Astronomer. Husband lives here.   Past Medical History:  Diagnosis Date   Chicken pox    Depression with anxiety    GERD (gastroesophageal reflux disease)    IUD (intrauterine device) in place    PCOS (polycystic ovarian syndrome)     Past Surgical History:  Procedure Laterality Date   BARIATRIC SURGERY  12/2016   MOUTH SURGERY  2016    Current Outpatient Medications  Medication Sig Dispense Refill   Biotin w/ Vitamins C & E (HAIR/SKIN/NAILS PO) Take by mouth.     buPROPion (WELLBUTRIN XL) 150 MG 24 hr tablet TAKE 1 TABLET BY MOUTH EVERY DAY 90 tablet 0   Calcium Carbonate (CALCIUM 600 PO) Take by mouth 2 (two) times daily.     clonazePAM (KLONOPIN) 0.5 MG tablet TAKE 1 TO 2 TABLETS BY MOUTH AT BEDTIME AS NEEDED 60 tablet 0   cyanocobalamin 1000 MCG tablet Take 1,000 mcg  by mouth daily.     Inositol-D Chiro-Inositol 2000-50 MG PACK Take by mouth. Taking for PCOS.     levonorgestrel (MIRENA) 20 MCG/24HR IUD 1 each by Intrauterine route once.     Omega-3 Fatty Acids (FISH OIL) 1360 MG CAPS Take by mouth.     Prenatal Vit-Fe Fumarate-FA (PRENATAL PO) Take by mouth.     Pyridoxine HCl (VITAMIN B6) 200 MG TABS Take by mouth.     sertraline (ZOLOFT) 50 MG tablet Take 1 tablet (50 mg total) by mouth daily. 90 tablet 0   tirzepatide (MOUNJARO) 5 MG/0.5ML Pen Inject into the skin.     vitamin C (ASCORBIC ACID) 500 MG tablet Take 500 mg by mouth daily.     No current facility-administered medications for this visit.    Family History  Problem Relation Age of Onset   Diabetes Father    Depression Father    Hypertension Father    Hyperlipidemia Father    Mental illness Father    Anxiety disorder Sister    Depression Maternal Grandfather    Diabetes Maternal Grandfather    Hearing loss Maternal Grandfather    Heart disease Maternal Grandfather  Kidney disease Maternal Grandfather    Mental illness Maternal Grandfather    Depression Maternal Grandmother    Diabetes Maternal Grandmother    COPD Paternal Grandfather    Alcohol abuse Paternal Grandfather    Asthma Paternal Grandfather    Diabetes Paternal Grandfather    Heart disease Paternal Grandfather    Hyperlipidemia Paternal Grandfather    Hypertension Paternal Grandfather    Bipolar disorder Cousin    Anxiety disorder Sister    Hypertension Mother    Lung cancer Paternal Grandmother    Depression Paternal Grandmother     Review of Systems  All other systems reviewed and are negative.   Exam:   BP 112/66   Pulse 72   Ht '5\' 7"'$  (1.702 m)   Wt 228 lb (103.4 kg)   SpO2 98%   BMI 35.71 kg/m   Weight change: '@WEIGHTCHANGE'$ @ Height:   Height: '5\' 7"'$  (170.2 cm)  Ht Readings from Last 3 Encounters:  12/09/21 '5\' 7"'$  (1.702 m)  09/07/20 '5\' 7"'$  (1.702 m)  05/02/19 5' 8.5" (1.74 m)    General  appearance: alert, cooperative and appears stated age Head: Normocephalic, without obvious abnormality, atraumatic Neck: no adenopathy, supple, symmetrical, trachea midline and thyroid normal to inspection and palpation Lungs: clear to auscultation bilaterally Cardiovascular: regular rate and rhythm Breasts: normal appearance, no masses or tenderness Abdomen: soft, non-tender; non distended,  no masses,  no organomegaly Extremities: extremities normal, atraumatic, no cyanosis or edema Skin: Skin color, texture, turgor normal. No rashes or lesions Lymph nodes: Cervical, supraclavicular, and axillary nodes normal. No abnormal inguinal nodes palpated Neurologic: Grossly normal   Pelvic: External genitalia:  no lesions              Urethra:  normal appearing urethra with no masses, tenderness or lesions              Bartholins and Skenes: normal                 Vagina: normal appearing vagina with normal color and discharge, no lesions              Cervix: no lesions and IUD strings 2 cm, IUD removed with ringed forceps               Bimanual Exam:  Uterus:   no masses or tenderness              Adnexa: no mass, fullness, tenderness               Rectovaginal: Confirms               Anus:  normal sphincter tone, no lesions  Gae Dry, CMA chaperoned for the exam.  1. Well woman exam Discussed breast self exam Discussed calcium and vit D intake Labs with primary, recommended she have a Rubella screen with her next blood draw.   2. PCOS (polycystic ovarian syndrome) Will pull IUD and calendar her cycles. Discussed the importance of endometrial protection. She will use condoms for contraception - medroxyPROGESTERone (PROVERA) 5 MG tablet; Take one tablet po qd x 5 days every other month if no spontaneous menses. You can increase to every month if needed.  Dispense: 15 tablet; Refill: 3  3. Amenorrhea H/O PCOS, using mirena IUD's for years. H/O oligomenorrhea prior to the IUD.  -  medroxyPROGESTERone (PROVERA) 5 MG tablet; Take one tablet po qd x 5 days every other month if no spontaneous menses. You can increase to  every month if needed.  Dispense: 15 tablet; Refill: 3  4. Encounter for IUD removal IUD removed  5. General counseling and advice on female contraception Will use condoms for now. Discussed risks of AMA, she will see how her cycles are, f/u with Rheumatology for +ANA and discuss coming off of Mounjaro with her primary prior to getting pregnant.   6. Screening for cervical cancer - Cytology - PAP  01/11/22 Addendum: Old records reviewed. 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

## 2021-12-09 NOTE — Patient Instructions (Signed)
Take provera as directed if no spontaneous cycles. Calendar all bleeding. If any question of pregnancy, check a pregnancy test prior to taking the provera.  EXERCISE   We recommended that you start or continue a regular exercise program for good health. Physical activity is anything that gets your body moving, some is better than none. The CDC recommends 150 minutes per week of Moderate-Intensity Aerobic Activity and 2 or more days of Muscle Strengthening Activity.  Benefits of exercise are limitless: helps weight loss/weight maintenance, improves mood and energy, helps with depression and anxiety, improves sleep, tones and strengthens muscles, improves balance, improves bone density, protects from chronic conditions such as heart disease, high blood pressure and diabetes and so much more. To learn more visit: WhyNotPoker.uy  DIET: Good nutrition starts with a healthy diet of fruits, vegetables, whole grains, and lean protein sources. Drink plenty of water for hydration. Minimize empty calories, sodium, sweets. For more information about dietary recommendations visit: GeekRegister.com.ee and http://schaefer-mitchell.com/  ALCOHOL:  Women should limit their alcohol intake to no more than 7 drinks/beers/glasses of wine (combined, not each!) per week. Moderation of alcohol intake to this level decreases your risk of breast cancer and liver damage.  If you are concerned that you may have a problem, or your friends have told you they are concerned about your drinking, there are many resources to help. A well-known program that is free, effective, and available to all people all over the nation is Alcoholics Anonymous.  Check out this site to learn more: BlockTaxes.se   CALCIUM AND VITAMIN D:  Adequate intake of calcium and Vitamin D are recommended for bone health.  You should be getting between 1000-1200 mg of calcium  and 800 units of Vitamin D daily between diet and supplements  PAP SMEARS:  Pap smears, to check for cervical cancer or precancers,  have traditionally been done yearly, scientific advances have shown that most women can have pap smears less often.  However, every woman still should have a physical exam from her gynecologist every year. It will include a breast check, inspection of the vulva and vagina to check for abnormal growths or skin changes, a visual exam of the cervix, and then an exam to evaluate the size and shape of the uterus and ovaries. We will also provide age appropriate advice regarding health maintenance, like when you should have certain vaccines, screening for sexually transmitted diseases, bone density testing, colonoscopy, mammograms, etc.   MAMMOGRAMS:  All women over 61 years old should have a routine mammogram.   COLON CANCER SCREENING: Now recommend starting at age 14. At this time colonoscopy is not covered for routine screening until 50. There are take home tests that can be done between 45-49.   COLONOSCOPY:  Colonoscopy to screen for colon cancer is recommended for all women at age 31.  We know, you hate the idea of the prep.  We agree, BUT, having colon cancer and not knowing it is worse!!  Colon cancer so often starts as a polyp that can be seen and removed at colonscopy, which can quite literally save your life!  And if your first colonoscopy is normal and you have no family history of colon cancer, most women don't have to have it again for 10 years.  Once every ten years, you can do something that may end up saving your life, right?  We will be happy to help you get it scheduled when you are ready.  Be sure to check your  insurance coverage so you understand how much it will cost.  It may be covered as a preventative service at no cost, but you should check your particular policy.      Breast Self-Awareness Breast self-awareness means being familiar with how your  breasts look and feel. It involves checking your breasts regularly and reporting any changes to your health care provider. Practicing breast self-awareness is important. A change in your breasts can be a sign of a serious medical problem. Being familiar with how your breasts look and feel allows you to find any problems early, when treatment is more likely to be successful. All women should practice breast self-awareness, including women who have had breast implants. How to do a breast self-exam One way to learn what is normal for your breasts and whether your breasts are changing is to do a breast self-exam. To do a breast self-exam: Look for Changes  Remove all the clothing above your waist. Stand in front of a mirror in a room with good lighting. Put your hands on your hips. Push your hands firmly downward. Compare your breasts in the mirror. Look for differences between them (asymmetry), such as: Differences in shape. Differences in size. Puckers, dips, and bumps in one breast and not the other. Look at each breast for changes in your skin, such as: Redness. Scaly areas. Look for changes in your nipples, such as: Discharge. Bleeding. Dimpling. Redness. A change in position. Feel for Changes Carefully feel your breasts for lumps and changes. It is best to do this while lying on your back on the floor and again while sitting or standing in the shower or tub with soapy water on your skin. Feel each breast in the following way: Place the arm on the side of the breast you are examining above your head. Feel your breast with the other hand. Start in the nipple area and make  inch (2 cm) overlapping circles to feel your breast. Use the pads of your three middle fingers to do this. Apply light pressure, then medium pressure, then firm pressure. The light pressure will allow you to feel the tissue closest to the skin. The medium pressure will allow you to feel the tissue that is a little  deeper. The firm pressure will allow you to feel the tissue close to the ribs. Continue the overlapping circles, moving downward over the breast until you feel your ribs below your breast. Move one finger-width toward the center of the body. Continue to use the  inch (2 cm) overlapping circles to feel your breast as you move slowly up toward your collarbone. Continue the up and down exam using all three pressures until you reach your armpit.  Write Down What You Find  Write down what is normal for each breast and any changes that you find. Keep a written record with breast changes or normal findings for each breast. By writing this information down, you do not need to depend only on memory for size, tenderness, or location. Write down where you are in your menstrual cycle, if you are still menstruating. If you are having trouble noticing differences in your breasts, do not get discouraged. With time you will become more familiar with the variations in your breasts and more comfortable with the exam. How often should I examine my breasts? Examine your breasts every month. If you are breastfeeding, the best time to examine your breasts is after a feeding or after using a breast pump. If you menstruate, the  best time to examine your breasts is 5-7 days after your period is over. During your period, your breasts are lumpier, and it may be more difficult to notice changes. When should I see my health care provider? See your health care provider if you notice: A change in shape or size of your breasts or nipples. A change in the skin of your breast or nipples, such as a reddened or scaly area. Unusual discharge from your nipples. A lump or thick area that was not there before. Pain in your breasts. Anything that concerns you.

## 2021-12-15 LAB — CYTOLOGY - PAP
Comment: NEGATIVE
High risk HPV: NEGATIVE

## 2022-01-02 ENCOUNTER — Other Ambulatory Visit: Payer: Self-pay | Admitting: Psychiatry

## 2022-01-02 DIAGNOSIS — F419 Anxiety disorder, unspecified: Secondary | ICD-10-CM

## 2022-01-02 DIAGNOSIS — F39 Unspecified mood [affective] disorder: Secondary | ICD-10-CM

## 2022-01-11 ENCOUNTER — Telehealth: Payer: Self-pay | Admitting: Obstetrics and Gynecology

## 2022-01-11 DIAGNOSIS — Z9889 Other specified postprocedural states: Secondary | ICD-10-CM | POA: Insufficient documentation

## 2022-01-11 DIAGNOSIS — R87612 Low grade squamous intraepithelial lesion on cytologic smear of cervix (LGSIL): Secondary | ICD-10-CM

## 2022-01-11 NOTE — Telephone Encounter (Signed)
Left message for patient to call, order placed.

## 2022-01-11 NOTE — Telephone Encounter (Signed)
Patient informed, message sent to appointments to schedule.  

## 2022-01-11 NOTE — Telephone Encounter (Signed)
Please let the patient know that I got a copy of her records from [facility]. 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 That history with her recent pap results of LSIL is an indication for a colposcopy. Please set her up for a colposcopy.

## 2022-01-11 NOTE — Telephone Encounter (Signed)
Patient scheduled on 03/24/22

## 2022-01-12 NOTE — Telephone Encounter (Signed)
Did the patient want to be scheduled out that far. I think it's fine if she is okay with it. Otherwise I would try and find her something sooner. Thanks.

## 2022-01-12 NOTE — Telephone Encounter (Signed)
Per appointments " Patient wanted something before November 15 which we do not have (unless 12:00 or 4:30 appointments) so patient choose December 21 but I did put her on cancellation list incase something opens up. "

## 2022-01-13 NOTE — Telephone Encounter (Signed)
Terra, Please look at my schedule and try and find a spot for her.

## 2022-01-27 ENCOUNTER — Ambulatory Visit (INDEPENDENT_AMBULATORY_CARE_PROVIDER_SITE_OTHER): Payer: BC Managed Care – PPO | Admitting: Psychiatry

## 2022-01-27 ENCOUNTER — Encounter: Payer: Self-pay | Admitting: Psychiatry

## 2022-01-27 DIAGNOSIS — F419 Anxiety disorder, unspecified: Secondary | ICD-10-CM

## 2022-01-27 DIAGNOSIS — F39 Unspecified mood [affective] disorder: Secondary | ICD-10-CM | POA: Diagnosis not present

## 2022-01-27 DIAGNOSIS — F5105 Insomnia due to other mental disorder: Secondary | ICD-10-CM

## 2022-01-27 MED ORDER — SERTRALINE HCL 50 MG PO TABS: 50.0000 mg | ORAL_TABLET | Freq: Every day | ORAL | 1 refills | Status: DC

## 2022-01-27 MED ORDER — CLONAZEPAM 0.5 MG PO TABS: ORAL_TABLET | ORAL | 0 refills | Status: DC

## 2022-01-27 MED ORDER — BUPROPION HCL ER (XL) 150 MG PO TB24: 150.0000 mg | ORAL_TABLET | Freq: Every day | ORAL | 1 refills | Status: DC

## 2022-01-27 NOTE — Progress Notes (Signed)
Bridget Rich [id number] 11-Oct-1985 36 y.o.  Subjective:   Patient ID:  Bridget Rich is a 36 y.o. (DOB October 23, 1985) female.  Chief Complaint:  Chief Complaint  Patient presents with   Follow-up    Anxiety, mood disturbance, and insomnia    HPI Bridget Rich presents to the office today for follow-up of anxiety, mood disturbance, and insomnia. She reports that she has weaned off Trileptal without difficulty or any worsening symptoms. She reports that her anxiety has improved. Denies any recent panic s/s. She has been using mindfulness and DBT skills when she notices some anxiety. She reports that she is falling asleep easier. She reports that she is having some difficulty getting up in the morning. She has noticed sadness in response to events in [place] and with friend that has connections to [place]. She reports that her energy and motivation have been "inconsistent... may be related to burnout." She reports that she has been working long hours and having a commute.  Concentration has been ok. Appetite has been ok. Denies impulsive or risky behaviors. She reports that her finances are "the best it has been." Denies excessive spending and only buying things on sale or are used. Not drinking. Denies binge eating. Denies SI.   She reports that she was having some difficulty with word finding resolved.   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, [place].   She has an apt with rheumatology and has upcoming cervical biopsy   She reports that DBT has been very helpful for her.   She has limited interaction with her father. Has seen her mother on a few occasions.   Past Psychiatric Medication Trials: Trileptal Prozac- Tolerated at 10 mg po qd. More anxious at 20 mg. Took for awhile. wellbutrin Sertraline Rexulti Abilify- helped initially and then had concentration issues Latuda- Akathisia Klonopin  GAD-7    Flowsheet Row Office Visit from 05/08/2018 in [place]  Total GAD-7 Score 0      PHQ2-9    [place] Office Visit from 05/08/2018 in [place]  PHQ-2 Total Score 1  PHQ-9 Total Score 1      Flowsheet Row ED from 09/07/2020 in [place] No Risk        Review of Systems:  Review of Systems  Constitutional:  Positive for fatigue.  Musculoskeletal:  Positive for arthralgias. Negative for gait problem.  Neurological:  Negative for tremors.  Psychiatric/Behavioral:         Please refer to HPI    Medications: I have reviewed the patient's current medications.  Current Outpatient Medications  Medication Sig Dispense Refill   Biotin w/ Vitamins C & E (HAIR/SKIN/NAILS PO) Take by mouth.     Calcium Carbonate (CALCIUM 600 PO) Take by mouth 2 (two) times daily.     cyanocobalamin 1000 MCG tablet Take 1,000 mcg by mouth daily.     Inositol-D Chiro-Inositol 2000-50 MG PACK Take by mouth. Taking for PCOS.     Omega-3 Fatty Acids (FISH OIL) 1360 MG CAPS Take by mouth.     Prenatal Vit-Fe Fumarate-FA (PRENATAL PO) Take by mouth.     tirzepatide (MOUNJARO) 10 MG/0.5ML Pen Inject into the skin.     vitamin C (ASCORBIC ACID) 500 MG tablet Take 500 mg by mouth daily.     buPROPion (WELLBUTRIN XL) 150 MG 24 hr tablet Take 1 tablet (150 mg total)  by mouth daily. 90 tablet 1   clonazePAM (KLONOPIN) 0.5 MG tablet TAKE 1 TO 2 TABLETS BY MOUTH AT BEDTIME AS NEEDED 30 tablet 0   medroxyPROGESTERone (PROVERA) 5 MG tablet Take one tablet po qd x 5 days every other month if no spontaneous menses. You can increase to every month if needed. (Patient not taking: Reported on 01/27/2022) 15 tablet 3   sertraline (ZOLOFT) 50 MG tablet Take 1 tablet (50 mg total) by mouth daily. 90 tablet 1   No current facility-administered medications for this visit.    Medication Side Effects: None  Allergies:  Allergies  Allergen Reactions   Augmentin [Amoxicillin-Pot  Clavulanate] Other (See Comments)    unknown   Ceclor [Cefaclor] Other (See Comments)    unknown   Other Other (See Comments)    IBU - s/p bariatric surgery    Past Medical History:  Diagnosis Date   Chicken pox    Depression with anxiety    GERD (gastroesophageal reflux disease)    IUD (intrauterine device) in place    PCOS (polycystic ovarian syndrome)     Past Medical History, Surgical history, Social history, and Family history were reviewed and updated as appropriate.   Please see review of systems for further details on the patient's review from today.   Objective:   Physical Exam:  There were no vitals taken for this visit.  Physical Exam Constitutional:      General: She is not in acute distress. Musculoskeletal:        General: No deformity.  Neurological:     Mental Status: She is alert and oriented to person, place, and time.     Coordination: Coordination normal.  Psychiatric:        Attention and Perception: Attention and perception normal. She does not perceive auditory or visual hallucinations.        Mood and Affect: Mood normal. Mood is not anxious or depressed. Affect is not labile, blunt, angry or inappropriate.        Speech: Speech normal.        Behavior: Behavior normal.        Thought Content: Thought content normal. Thought content is not paranoid or delusional. Thought content does not include homicidal or suicidal ideation. Thought content does not include homicidal or suicidal plan.        Cognition and Memory: Cognition and memory normal.        Judgment: Judgment normal.     Comments: Insight intact     Lab Review:     Component Value Date/Time   NA 138 09/07/2020 2343   NA 136 (A) 01/25/2017 0000   K 3.4 (L) 09/07/2020 2343   CL 103 09/07/2020 2343   CO2 27 09/07/2020 2343   GLUCOSE 84 09/07/2020 2343   BUN 13 09/07/2020 2343   CREATININE 0.75 09/07/2020 2343   CALCIUM 8.9 09/07/2020 2343   AST 23 01/25/2017 0000   ALT 24  01/25/2017 0000   GFRNONAA >60 09/07/2020 2343   GFRAA >60 11/20/2014 0041       Component Value Date/Time   WBC 10.8 (H) 09/07/2020 2343   RBC 4.37 09/07/2020 2343   HGB 13.0 09/07/2020 2343   HCT 39.2 09/07/2020 2343   PLT 278 09/07/2020 2343   MCV 89.7 09/07/2020 2343   MCH 29.7 09/07/2020 2343   MCHC 33.2 09/07/2020 2343   RDW 12.3 09/07/2020 2343   LYMPHSABS 4.0 09/07/2020 2343   MONOABS 0.9 09/07/2020 2343  EOSABS 0.2 09/07/2020 2343   BASOSABS 0.1 09/07/2020 2343    No results found for: "POCLITH", "LITHIUM"   No results found for: "PHENYTOIN", "PHENOBARB", "VALPROATE", "CBMZ"   .res Assessment: Plan:    Pt seen for 30 minutes and time spent discussing response to decrease and discontinuation of Trileptal. Discussed not resuming Trileptal or another mood stabilizer since she denies any impulsivity, mood lability, elevated moods, or risky behavior. She reports that she notices more mood stability and emotional regulation since starting DBT treatment. She denies any current concerns.  Continue Wellbutrin XL 150 mg po qd for depression.  Continue Sertraline 50 mg daily for anxiety and mood.  Continue Klonopin prn severe anxiety. Pt reports that she has not taken Klonopin prn recently and typically uses it only for air travel. Will send in new script since previous script from 02/02/21 has expired.  Pt to follow-up in 6 months or sooner if clinically indicated.  Patient advised to contact office with any questions, adverse effects, or acute worsening in signs and symptoms.   Bridget Rich was seen today for follow-up.  Diagnoses and all orders for this visit:  Episodic mood disorder (HCC) -     buPROPion (WELLBUTRIN XL) 150 MG 24 hr tablet; Take 1 tablet (150 mg total) by mouth daily. -     sertraline (ZOLOFT) 50 MG tablet; Take 1 tablet (50 mg total) by mouth daily.  Anxiety disorder, unspecified type -     sertraline (ZOLOFT) 50 MG tablet; Take 1 tablet (50 mg total) by  mouth daily. -     clonazePAM (KLONOPIN) 0.5 MG tablet; TAKE 1 TO 2 TABLETS BY MOUTH AT BEDTIME AS NEEDED  Insomnia secondary to anxiety -     clonazePAM (KLONOPIN) 0.5 MG tablet; TAKE 1 TO 2 TABLETS BY MOUTH AT BEDTIME AS NEEDED     Please see After Visit Summary for patient specific instructions.  Future Appointments  Date Time Provider [place]  02/10/2022 10:30 AM Salvadore Dom, MD [facility] None  07/29/2022 10:00 AM Thayer Headings, PMHNP [facility] None    No orders of the defined types were placed in this encounter.   -------------------------------

## 2022-01-31 ENCOUNTER — Ambulatory Visit (INDEPENDENT_AMBULATORY_CARE_PROVIDER_SITE_OTHER): Payer: BC Managed Care – PPO

## 2022-01-31 ENCOUNTER — Ambulatory Visit
Admission: EM | Admit: 2022-01-31 | Discharge: 2022-01-31 | Disposition: A | Discharge status: Home / Self Care | Payer: BC Managed Care – PPO | Attending: Emergency Medicine | Admitting: Emergency Medicine

## 2022-01-31 DIAGNOSIS — R110 Nausea: Secondary | ICD-10-CM

## 2022-01-31 DIAGNOSIS — R109 Unspecified abdominal pain: Secondary | ICD-10-CM | POA: Diagnosis not present

## 2022-01-31 DIAGNOSIS — M549 Dorsalgia, unspecified: Secondary | ICD-10-CM

## 2022-01-31 LAB — POCT URINALYSIS DIP (MANUAL ENTRY)
Bilirubin, UA: NEGATIVE
Blood, UA: NEGATIVE
Glucose, UA: NEGATIVE mg/dL
Ketones, POC UA: NEGATIVE mg/dL
Leukocytes, UA: NEGATIVE
Nitrite, UA: NEGATIVE
Protein Ur, POC: NEGATIVE mg/dL
Spec Grav, UA: 1.015 (ref 1.010–1.025)
Urobilinogen, UA: 0.2 E.U./dL
pH, UA: 5.5 (ref 5.0–8.0)

## 2022-01-31 LAB — POCT URINE PREGNANCY: Preg Test, Ur: NEGATIVE

## 2022-01-31 MED ORDER — ONDANSETRON 4 MG PO TBDP: 4.0000 mg | ORAL_TABLET | Freq: Three times a day (TID) | ORAL | 0 refills | Status: DC | PRN

## 2022-01-31 NOTE — ED Provider Notes (Signed)
Roderic Palau    CSN: [id number] Arrival date & time: 01/31/22  1432      History   Chief Complaint Chief Complaint  Patient presents with   Flank Pain    HPI Mysty LEATHA ROHNER is a 36 y.o. female.  Patient presents with sudden onset of right bilateral back pain which radiated around to her right abdomen and groin which started this morning while sitting on the couch.  The pain at worst was sharp and 7/10.  Currently the pain is dull and improved to 5/10.  At the time of the worst pain, patient also felt short of breath and had chest pressure; these symptoms have resolved.  She also reports mild nausea.  No fever, chills, cough, vomiting, diarrhea, constipation, dysuria, hematuria, vaginal discharge, pelvic pain, or other symptoms.  No treatments at home.  Her medical history includes bariatric surgery in 2018.  The history is provided by the patient and medical records.    Past Medical History:  Diagnosis Date   Chicken pox    Depression with anxiety    GERD (gastroesophageal reflux disease)    IUD (intrauterine device) in place    PCOS (polycystic ovarian syndrome)     Patient Active Problem List   Diagnosis Date Noted   H/O LEEP 01/11/2022   Episodic mood disorder ([place]) 08/25/2020   Low back pain 05/02/2019   Nonallopathic lesion of lumbar region 05/02/2019   Nonallopathic lesion of sacral region 05/02/2019   Nonallopathic lesion of thoracic region 05/02/2019   Nonallopathic lesion of cervical region 05/02/2019   Nonallopathic lesion of rib cage 05/02/2019   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 Authorizing Provider  ondansetron (ZOFRAN-ODT) 4 MG disintegrating tablet Take 1 tablet (4 mg total) by mouth every 8 (eight) hours as needed for nausea or vomiting. 01/31/22  Yes Sharion Balloon, NP  Biotin w/ Vitamins C & E (HAIR/SKIN/NAILS PO) Take by mouth.    [provider]  buPROPion (WELLBUTRIN XL) 150 MG 24 hr tablet Take 1 tablet (150 mg total) by mouth daily. 01/27/22   Thayer Headings, PMHNP  Calcium Carbonate (CALCIUM 600 PO) Take by mouth 2 (two) times daily.    [provider]  clonazePAM (KLONOPIN) 0.5 MG tablet TAKE 1 TO 2 TABLETS BY MOUTH AT BEDTIME AS NEEDED 01/27/22   Thayer Headings, PMHNP  cyanocobalamin 1000 MCG tablet Take 1,000 mcg by mouth daily.    [provider]  Inositol-D Chiro-Inositol 2000-50 MG PACK Take by mouth. Taking for PCOS.    [provider]  medroxyPROGESTERone (PROVERA) 5 MG tablet Take one tablet po qd x 5 days every other month  if no spontaneous menses. You can increase to every month if needed. Patient not taking: Reported on 01/27/2022 12/09/21   Salvadore Dom, MD  Omega-3 Fatty Acids (FISH OIL) 1360 MG CAPS Take by mouth.    [provider]  Prenatal Vit-Fe Fumarate-FA (PRENATAL PO) Take by mouth.    [provider]  sertraline (ZOLOFT) 50 MG tablet Take 1 tablet (50 mg total) by mouth daily. 01/27/22   Thayer Headings, PMHNP  tirzepatide [facility]) 10 MG/0.5ML Pen Inject into the skin. 01/18/22 02/17/22  [provider]  vitamin C (ASCORBIC ACID) 500 MG tablet Take 500 mg by mouth daily.    [provider]    Family History Family History  Problem Relation Age of Onset   Diabetes Father    Depression Father    Hypertension Father    Hyperlipidemia Father     Mental illness Father    Anxiety disorder Sister    Depression Maternal Grandfather    Diabetes Maternal Grandfather    Hearing loss Maternal Grandfather    Heart disease Maternal Grandfather    Kidney disease Maternal Grandfather    Mental illness Maternal Grandfather    Depression Maternal Grandmother    Diabetes Maternal Grandmother    COPD Paternal Grandfather    Alcohol abuse Paternal Grandfather    Asthma Paternal Grandfather    Diabetes Paternal Grandfather    Heart disease Paternal Grandfather    Hyperlipidemia Paternal Grandfather    Hypertension Paternal Grandfather    Bipolar disorder Cousin    Anxiety disorder Sister    Hypertension Mother    Lung cancer Paternal Grandmother    Depression Paternal Grandmother     Social History Social History   Tobacco Use   Smoking status: Never   Smokeless tobacco: Never  Vaping Use   Vaping Use: Never used  Substance Use Topics   Alcohol use: Yes    Comment: Occasionally 6-10 over a month    Drug use: No     Allergies   Augmentin [amoxicillin-pot clavulanate], Ceclor [cefaclor], and Other   Review of Systems Review of Systems  Constitutional:  Negative for chills and fever.  Respiratory:  Positive for shortness of breath. Negative for cough.   Cardiovascular:  Positive for chest pain. Negative for palpitations.  Gastrointestinal:  Positive for abdominal pain and nausea. Negative for constipation, diarrhea and vomiting.  Genitourinary:  Negative for dysuria, hematuria, pelvic pain and vaginal discharge.  Musculoskeletal:  Positive for back pain. Negative for gait problem.  Skin:  Negative for color change, rash and wound.  All other systems reviewed and are negative.    Physical Exam Triage Vital Signs ED Triage Vitals  Enc Vitals Group     BP 01/31/22 1444 112/78     Pulse Rate 01/31/22 1437 99     Resp 01/31/22 1437 18     Temp 01/31/22 1437 98.6 F (37 C)     Temp src --      SpO2 01/31/22 1437 96  %     Weight 01/31/22 1442 225 lb (102.1 kg)     Height 01/31/22 1442 5' 7.5" (1.715 m)     Head Circumference --      Peak Flow --      Pain Score 01/31/22 1439 5     Pain Loc --      Pain Edu? --      Excl. in [place]? --    No data found.  Updated Vital Signs BP  112/78   Pulse 99   Temp 98.6 F (37 C)   Resp 18   Ht 5' 7.5" (1.715 m)   Wt 225 lb (102.1 kg)   LMP 01/15/2022   SpO2 96%   BMI 34.72 kg/m   Visual Acuity Right Eye Distance:   Left Eye Distance:   Bilateral Distance:    Right Eye Near:   Left Eye Near:    Bilateral Near:     Physical Exam Vitals and nursing note reviewed.  Constitutional:      General: She is not in acute distress.    Appearance: Normal appearance. She is well-developed. She is not ill-appearing.  HENT:     Mouth/Throat:     Mouth: Mucous membranes are moist.  Cardiovascular:     Rate and Rhythm: Normal rate and regular rhythm.     Heart sounds: Normal heart sounds.  Pulmonary:     Effort: Pulmonary effort is normal. No respiratory distress.     Breath sounds: Normal breath sounds.  Abdominal:     General: Bowel sounds are normal.     Palpations: Abdomen is soft.     Tenderness: There is no abdominal tenderness. There is no right CVA tenderness, left CVA tenderness, guarding or rebound.  Musculoskeletal:        General: No swelling, tenderness, deformity or signs of injury. Normal range of motion.     Cervical back: Neck supple.  Skin:    General: Skin is warm and dry.     Capillary Refill: Capillary refill takes less than 2 seconds.     Findings: No bruising, erythema, lesion or rash.  Neurological:     General: No focal deficit present.     Mental Status: She is alert and oriented to person, place, and time.     Sensory: No sensory deficit.     Motor: No weakness.     Gait: Gait normal.  Psychiatric:        Mood and Affect: Mood normal.        Behavior: Behavior normal.      UC Treatments / Results  Labs (all labs  ordered are listed, but only abnormal results are displayed) Labs Reviewed  CBC  COMPREHENSIVE METABOLIC PANEL  POCT URINALYSIS DIP (MANUAL ENTRY)  POCT URINE PREGNANCY    EKG   Radiology DG Chest 2 View  Result Date: 01/31/2022 CLINICAL DATA:  Mid back pain EXAM: CHEST - 2 VIEW COMPARISON:  11/20/2014 FINDINGS: No pleural effusion. No pneumothorax. Focal airspace opacity. Normal cardiac and mediastinal contours. No displaced rib fractures. Visualized upper abdomen is unremarkable. Vertebral bodies are poorly visualized due to superimposition of soft tissues. Within this limitation, vertebral body heights are maintained. IMPRESSION: No radiographic finding to explain back pain. Electronically Signed   By: Marin Roberts M.D.   On: 01/31/2022 15:52    Procedures Procedures (including critical care time)  Medications Ordered in UC Medications - No data to display  Initial Impression / Assessment and Plan / UC Course  I have reviewed the triage vital signs and the nursing notes.  Pertinent labs & imaging results that were available during my care of the patient were reviewed by me and considered in my medical decision making (see chart for details).   Mid back pain, right side abdominal pain.  Afebrile, vital signs are stable.  Patient is well-appearing and her exam is reassuring.  Discussed limitations of evaluation in an urgent care setting.  Patient declines transfer to the  ED.  Urine pregnancy negative.  Urine negative.  EKG shows sinus rhythm, rate 64, no ST elevation, compared to previous from 2016.  CXR negative.  CBC and CMP pending.  Discussed symptomatic treatment including Tylenol, rest, hydration.  ED precautions discussed.  Education provided on abdominal pain and back pain.  Instructed patient to follow up with her PCP tomorrow.  She agrees to plan of care.     Final Clinical Impressions(s) / UC Diagnoses   Final diagnoses:  Mid back pain  Right sided abdominal pain   Nausea without vomiting     Discharge Instructions      Take Tylenol as needed for discomfort.  Take the antinausea medication Zofran as directed.  Keep yourself hydrated with clear liquids, such as water.    Go to the emergency department if you have persistent or worsening symptoms.    Follow up with your primary care provider tomorrow.          ED Prescriptions     Medication Sig Dispense Auth. Provider   ondansetron (ZOFRAN-ODT) 4 MG disintegrating tablet Take 1 tablet (4 mg total) by mouth every 8 (eight) hours as needed for nausea or vomiting. 20 tablet Sharion Balloon, NP      PDMP not reviewed this encounter.   Sharion Balloon, NP 01/31/22 [phone]

## 2022-01-31 NOTE — Discharge Instructions (Addendum)
Take Tylenol as needed for discomfort.  Take the antinausea medication Zofran as directed.  Keep yourself hydrated with clear liquids, such as water.    Go to the emergency department if you have persistent or worsening symptoms.    Follow up with your primary care provider tomorrow.

## 2022-01-31 NOTE — ED Triage Notes (Signed)
Patient to Urgent Care with complaints of right sided flank pain that radiated into groin that started this morning. States that it is intermittent. States that she was resting with the pain started. Reports when the pain hits it is sharp. Denies hx of kidney stones. Denies any urinary symptoms.  Hx of bariatric surgery.

## 2022-02-01 LAB — CBC
Hematocrit: 41.8 % (ref 34.0–46.6)
Hemoglobin: 14.1 g/dL (ref 11.1–15.9)
MCH: 29.6 pg (ref 26.6–33.0)
MCHC: 33.7 g/dL (ref 31.5–35.7)
MCV: 88 fL (ref 79–97)
Platelets: 275 10*3/uL (ref 150–450)
RBC: 4.77 x10E6/uL (ref 3.77–5.28)
RDW: 12.6 % (ref 11.7–15.4)
WBC: 7.2 10*3/uL (ref 3.4–10.8)

## 2022-02-01 LAB — COMPREHENSIVE METABOLIC PANEL
ALT: 19 IU/L (ref 0–32)
AST: 18 IU/L (ref 0–40)
Albumin/Globulin Ratio: 1.9 (ref 1.2–2.2)
Albumin: 4.4 g/dL (ref 3.9–4.9)
Alkaline Phosphatase: 50 IU/L (ref 44–121)
BUN/Creatinine Ratio: 9 (ref 9–23)
BUN: 9 mg/dL (ref 6–20)
Bilirubin Total: 0.2 mg/dL (ref 0.0–1.2)
CO2: 23 mmol/L (ref 20–29)
Calcium: 9.5 mg/dL (ref 8.7–10.2)
Chloride: 104 mmol/L (ref 96–106)
Creatinine, Ser: 0.95 mg/dL (ref 0.57–1.00)
Globulin, Total: 2.3 g/dL (ref 1.5–4.5)
Glucose: 83 mg/dL (ref 70–99)
Potassium: 4.7 mmol/L (ref 3.5–5.2)
Sodium: 140 mmol/L (ref 134–144)
Total Protein: 6.7 g/dL (ref 6.0–8.5)
eGFR: 81 mL/min/{1.73_m2} (ref >59)

## 2022-02-09 ENCOUNTER — Telehealth: Payer: Self-pay | Admitting: *Deleted

## 2022-02-09 NOTE — Telephone Encounter (Signed)
Patient scheduled for C&B tomorrow she asked if her husband would be able to be in the room with her during procedure.  Patient reports she asked before and was told no. I checked with Dr.Jertson and she was fine with patient being in room during procedure. Patient said she was nervous and him being there will help her. Patient aware Dr.Jertson said yes.

## 2022-02-10 ENCOUNTER — Encounter: Payer: Self-pay | Admitting: Obstetrics and Gynecology

## 2022-02-10 ENCOUNTER — Ambulatory Visit: Payer: BC Managed Care – PPO

## 2022-02-10 ENCOUNTER — Other Ambulatory Visit (HOSPITAL_COMMUNITY)
Admission: RE | Admit: 2022-02-10 | Discharge: 2022-02-10 | Disposition: A | Discharge status: Home / Self Care | Payer: BC Managed Care – PPO | Source: Ambulatory Visit | Attending: Obstetrics and Gynecology | Admitting: Obstetrics and Gynecology

## 2022-02-10 ENCOUNTER — Ambulatory Visit (INDEPENDENT_AMBULATORY_CARE_PROVIDER_SITE_OTHER): Payer: BC Managed Care – PPO | Admitting: Obstetrics and Gynecology

## 2022-02-10 VITALS — BP 128/78 | HR 99 | Wt 222.0 lb

## 2022-02-10 DIAGNOSIS — Z01812 Encounter for preprocedural laboratory examination: Secondary | ICD-10-CM

## 2022-02-10 DIAGNOSIS — R87612 Low grade squamous intraepithelial lesion on cytologic smear of cervix (LGSIL): Secondary | ICD-10-CM | POA: Diagnosis not present

## 2022-02-10 DIAGNOSIS — Z9889 Other specified postprocedural states: Secondary | ICD-10-CM

## 2022-02-10 LAB — PREGNANCY, URINE: Preg Test, Ur: NEGATIVE

## 2022-02-10 NOTE — Progress Notes (Signed)
GYNECOLOGY  VISIT   HPI: 36 y.o.   Married White or Caucasian Not Hispanic or Latino  female   [id number] with Patient's last menstrual period was 01/15/2022.   here for colposcopy    She had a leep on 04/12/19, path with CIN III, negative margins. Pap on 9/23 with lsil, negative hpv.  GYNECOLOGIC HISTORY: Patient's last menstrual period was 01/15/2022. Contraception: condoms  Menopausal hormone therapy: none        OB History     Gravida  0   Para  0   Term  0   Preterm  0   AB  0   Living  0      SAB  0   IAB  0   Ectopic  0   Multiple  0   Live Births  0              Patient Active Problem List   Diagnosis Date Noted   H/O LEEP 01/11/2022   Episodic mood disorder ([place]) 08/25/2020   Low back pain 05/02/2019   Nonallopathic lesion of lumbar region 05/02/2019   Nonallopathic lesion of sacral region 05/02/2019   Nonallopathic lesion of thoracic region 05/02/2019   Nonallopathic lesion of cervical region 05/02/2019   Nonallopathic lesion of rib cage 05/02/2019   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, not elsewhere classified 03/11/2019   Mild scoliosis 03/11/2019   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     buPROPion (WELLBUTRIN XL) 150 MG 24 hr tablet Take 1 tablet (150 mg total) by mouth daily. 90 tablet 1   Calcium Carbonate (CALCIUM 600 PO) Take by mouth 2 (two) times daily.     clonazePAM (KLONOPIN) 0.5 MG tablet TAKE 1 TO 2 TABLETS  BY MOUTH AT BEDTIME AS NEEDED 30 tablet 0   cyanocobalamin 1000 MCG tablet Take 1,000 mcg by mouth daily.     Inositol-D Chiro-Inositol 2000-50 MG PACK Take by mouth. Taking for PCOS.     Omega-3 Fatty Acids (FISH OIL) 1360 MG CAPS Take by mouth.     Prenatal Vit-Fe Fumarate-FA (PRENATAL PO) Take by mouth.     sertraline (ZOLOFT) 50 MG tablet Take 1 tablet (50 mg total) by mouth daily. 90 tablet 1   tirzepatide (MOUNJARO) 10 MG/0.5ML Pen Inject into the skin.     vitamin C (ASCORBIC ACID) 500 MG tablet Take 500 mg by mouth daily.     medroxyPROGESTERone (PROVERA) 5 MG tablet Take one tablet po qd x 5 days every other month if no spontaneous menses. You can increase to every month if needed. (Patient not taking: Reported on 02/10/2022) 15 tablet 3   ondansetron (ZOFRAN-ODT) 4 MG disintegrating tablet Take 1 tablet (4 mg total) by mouth every 8 (eight) hours as needed for nausea or vomiting. (Patient not taking: Reported on 02/10/2022) 20 tablet 0   No current facility-administered medications for this visit.  ALLERGIES: Augmentin [amoxicillin-pot clavulanate], Ceclor [cefaclor], and Other  Family History  Problem Relation Age of Onset   Diabetes Father    Depression Father    Hypertension Father    Hyperlipidemia Father    Mental illness Father    Anxiety disorder Sister    Depression Maternal Grandfather    Diabetes Maternal Grandfather    Hearing loss Maternal Grandfather    Heart disease Maternal Grandfather    Kidney disease Maternal Grandfather    Mental illness Maternal Grandfather    Depression Maternal Grandmother    Diabetes Maternal Grandmother    COPD Paternal Grandfather    Alcohol abuse Paternal Grandfather    Asthma Paternal Grandfather    Diabetes Paternal Grandfather    Heart disease Paternal Grandfather    Hyperlipidemia Paternal Grandfather    Hypertension Paternal Grandfather    Bipolar disorder Cousin    Anxiety disorder Sister    Hypertension Mother     Lung cancer Paternal Grandmother    Depression Paternal Grandmother     Social History   Socioeconomic History   Marital status: Married    Spouse name: Not on file   Number of children: Not on file   Years of education: Not on file   Highest education level: Not on file  Occupational History   Not on file  Tobacco Use   Smoking status: Never   Smokeless tobacco: Never  Vaping Use   Vaping Use: Never used  Substance and Sexual Activity   Alcohol use: Yes    Comment: Occasionally 6-10 over a month    Drug use: No   Sexual activity: Yes    Partners: Male    Comment: iud  Other Topics Concern   Not on file  Social History Narrative   Marital status/children/pets: single   Education/employment: Master's degree, Psychologist, clinical.    Safety:      -Wears a bicycle helmet riding a bike: Yes     -smoke alarm in the home:Yes     - wears seatbelt: Yes     - Feels safe in their relationships: Yes   Social Determinants of Health   Financial Resource Strain: Not on file  Food Insecurity: Not on file  Transportation Needs: Not on file  Physical Activity: Not on file  Stress: Not on file  Social Connections: Not on file  Intimate Partner Violence: Not on file    Review of Systems  All other systems reviewed and are negative.   PHYSICAL EXAMINATION:    BP 128/78   Pulse 99   Wt 222 lb (100.7 kg)   LMP 01/15/2022   SpO2 100%   BMI 34.26 kg/m     General appearance: alert, cooperative and appears stated age  Pelvic: External genitalia:  no lesions              Urethra:  normal appearing urethra with no masses, tenderness or lesions              Bartholins and Skenes: normal                 Vagina: normal appearing vagina with normal color and discharge, no lesions              Cervix: no lesions  Colposcopy: unsatisfactory, mild aceto-white changes at 5 o'clock, biopsy done. ECC done. Negative lugols of the upper vagina. Hemostasis obtained with use of  monsels.  Chaperone was present for exam.  1. LGSIL on Pap smear of cervix - Colposcopy - Surgical pathology( / [facility])  2. History of loop electrical excision procedure (LEEP) In 1/21, path with CIN III, negative margins  3. Pre-procedure lab exam - Pregnancy, urine

## 2022-02-10 NOTE — Patient Instructions (Signed)

## 2022-02-14 LAB — SURGICAL PATHOLOGY

## 2022-03-07 ENCOUNTER — Other Ambulatory Visit: Payer: Self-pay | Admitting: Psychiatry

## 2022-03-07 DIAGNOSIS — F419 Anxiety disorder, unspecified: Secondary | ICD-10-CM

## 2022-03-16 ENCOUNTER — Telehealth: Payer: Self-pay | Admitting: Psychiatry

## 2022-03-16 DIAGNOSIS — F419 Anxiety disorder, unspecified: Secondary | ICD-10-CM

## 2022-03-16 NOTE — Telephone Encounter (Signed)
PT called around 1pm. Has had trouble sleeping over the last month, both getting to sleep and staying asleep. Her sister has had hydroxyzine and she wonders if she can try this, or another med for sleep. [facility] [place]

## 2022-03-17 MED ORDER — HYDROXYZINE PAMOATE 25 MG PO CAPS: 25.0000 mg | ORAL_CAPSULE | Freq: Every evening | ORAL | 2 refills | Status: DC | PRN

## 2022-03-17 NOTE — Addendum Note (Signed)
Addended by: Sharyl Nimrod on: 03/17/2022 11:23 AM   Modules accepted: Orders

## 2022-03-17 NOTE — Telephone Encounter (Signed)
Script for Hydroxyzine sent to pharmacy.

## 2022-03-17 NOTE — Telephone Encounter (Signed)
Patient is asking to try hydroxyzine for sleep. I reviewed sleep hygiene with her.  Doesn't consume caffeine on a daily basis, averages one beverage per day, tries to go to bed the same time, no electronics an hour before bed.   [facility] [address] [facility] [place].  She said pharmacy will send her an alert when Rx is available.

## 2022-03-20 ENCOUNTER — Emergency Department: Payer: BC Managed Care – PPO

## 2022-03-20 ENCOUNTER — Other Ambulatory Visit: Payer: Self-pay

## 2022-03-20 ENCOUNTER — Emergency Department
Admission: EM | Admit: 2022-03-20 | Discharge: 2022-03-20 | Disposition: A | Discharge status: Home / Self Care | Payer: BC Managed Care – PPO | Attending: Emergency Medicine | Admitting: Emergency Medicine

## 2022-03-20 DIAGNOSIS — R110 Nausea: Secondary | ICD-10-CM | POA: Diagnosis not present

## 2022-03-20 DIAGNOSIS — R109 Unspecified abdominal pain: Secondary | ICD-10-CM | POA: Diagnosis present

## 2022-03-20 DIAGNOSIS — R1031 Right lower quadrant pain: Secondary | ICD-10-CM | POA: Diagnosis not present

## 2022-03-20 LAB — COMPREHENSIVE METABOLIC PANEL
ALT: 20 U/L (ref 0–44)
AST: 20 U/L (ref 15–41)
Albumin: 4.4 g/dL (ref 3.5–5.0)
Alkaline Phosphatase: 44 U/L (ref 38–126)
Anion gap: 9 (ref 5–15)
BUN: 17 mg/dL (ref 6–20)
CO2: 24 mmol/L (ref 22–32)
Calcium: 9.4 mg/dL (ref 8.9–10.3)
Chloride: 104 mmol/L (ref 98–111)
Creatinine, Ser: 0.84 mg/dL (ref 0.44–1.00)
GFR, Estimated: >60 mL/min (ref >60)
Glucose, Bld: 102 mg/dL — ABNORMAL HIGH (ref 70–99)
Potassium: 4.5 mmol/L (ref 3.5–5.1)
Sodium: 137 mmol/L (ref 135–145)
Total Bilirubin: 0.9 mg/dL (ref 0.3–1.2)
Total Protein: 8.1 g/dL (ref 6.5–8.1)

## 2022-03-20 LAB — CBC
HCT: 47.1 % — ABNORMAL HIGH (ref 36.0–46.0)
Hemoglobin: 15.3 g/dL — ABNORMAL HIGH (ref 12.0–15.0)
MCH: 28.8 pg (ref 26.0–34.0)
MCHC: 32.5 g/dL (ref 30.0–36.0)
MCV: 88.5 fL (ref 80.0–100.0)
Platelets: 254 10*3/uL (ref 150–400)
RBC: 5.32 MIL/uL — ABNORMAL HIGH (ref 3.87–5.11)
RDW: 12.7 % (ref 11.5–15.5)
WBC: 10.2 10*3/uL (ref 4.0–10.5)
nRBC: 0 % (ref 0.0–0.2)

## 2022-03-20 LAB — URINALYSIS, ROUTINE W REFLEX MICROSCOPIC
Bilirubin Urine: NEGATIVE
Glucose, UA: NEGATIVE mg/dL
Hgb urine dipstick: NEGATIVE
Ketones, ur: NEGATIVE mg/dL
Leukocytes,Ua: NEGATIVE
Nitrite: NEGATIVE
Protein, ur: NEGATIVE mg/dL
Specific Gravity, Urine: 1.023 (ref 1.005–1.030)
pH: 6 (ref 5.0–8.0)

## 2022-03-20 LAB — PREGNANCY, URINE: Preg Test, Ur: NEGATIVE

## 2022-03-20 LAB — LIPASE, BLOOD: Lipase: 40 U/L (ref 11–51)

## 2022-03-20 MED ORDER — LACTATED RINGERS IV BOLUS
1000.0000 mL | Freq: Once | INTRAVENOUS | Status: AC
  Administered 2022-03-20: 1000 mL via INTRAVENOUS

## 2022-03-20 MED ORDER — KETOROLAC TROMETHAMINE 15 MG/ML IJ SOLN
15.0000 mg | Freq: Once | INTRAMUSCULAR | Status: AC
  Administered 2022-03-20: 15 mg via INTRAVENOUS
  Filled 2022-03-20: qty 1

## 2022-03-20 MED ORDER — ONDANSETRON HCL 4 MG/2ML IJ SOLN
4.0000 mg | Freq: Once | INTRAMUSCULAR | Status: AC
  Administered 2022-03-20: 4 mg via INTRAVENOUS
  Filled 2022-03-20: qty 2

## 2022-03-20 MED ORDER — IOHEXOL 300 MG/ML  SOLN
100.0000 mL | Freq: Once | INTRAMUSCULAR | Status: AC | PRN
  Administered 2022-03-20: 100 mL via INTRAVENOUS

## 2022-03-20 NOTE — Discharge Instructions (Addendum)
Your CAT scan blood work and urine sample were all reassuring.  We do not know exactly what is causing your pain.  Please stick to clear liquids while you are still feeling nauseous.  You can take Tylenol.  If pain is worsening or you to keep anything down please return to the emergency department.

## 2022-03-20 NOTE — ED Notes (Signed)
Several unsuccessful IV attempts by various staff members. Dr. Starleen Blue aware.

## 2022-03-20 NOTE — ED Triage Notes (Signed)
Pt reports woke up with some right flank pain. Pt reports pain radiates around to her back and down her RLQ. Pt reports has some nausea as well. Denies urinary sx's or fevers.

## 2022-03-20 NOTE — ED Provider Notes (Signed)
[facility] Provider Note    Event Date/Time   First MD Initiated Contact with Patient 03/20/22 1111     (approximate)   History   Flank Pain, Nausea, and Back Pain   HPI  Bridget Rich is a 36 y.o. female past medical history of bariatric surgery who presents with right lower quadrant and right flank pain.  Patient symptoms started when she woke up this morning.  Pain initially was in the right lower quadrant.  She was able to fall back asleep and then woke up again with worse pain that was radiating to the right flank.  Her entire right back hurts as well as right lower quadrant.  Pain has been fairly constant but has paroxysms where it worsens.  She has had multiple episodes of nonbloody diarrhea 1 episode of emesis and nausea.  Denies fevers chills or urinary symptoms.  Patient has history of bariatric surgery in 2018 at [facility].     Past Medical History:  Diagnosis Date   Chicken pox    Depression with anxiety    GERD (gastroesophageal reflux disease)    IUD (intrauterine device) in place    PCOS (polycystic ovarian syndrome)     Patient Active Problem List   Diagnosis Date Noted   H/O LEEP 01/11/2022   Episodic mood disorder ([place]) 08/25/2020   Low back pain 05/02/2019   Nonallopathic lesion of lumbar region 05/02/2019   Nonallopathic lesion of sacral region 05/02/2019   Nonallopathic lesion of thoracic region 05/02/2019   Nonallopathic lesion of cervical region 05/02/2019   Nonallopathic lesion of rib cage 05/02/2019   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, not elsewhere classified 03/11/2019   Mild scoliosis 03/11/2019   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 --      Excl. in [place]? --     Most recent vital signs: Vitals:   03/20/22 0935  BP: 108/74  Pulse: (!) 108  Resp: 19  Temp: 98.6 F (37 C)  SpO2: 98%     General: Awake, no distress.  CV:  Good peripheral perfusion.  Resp:  Normal effort.  Abd:  No distention.  Mild tenderness palpation in the right lower quadrant and right upper quadrant without guarding, right CVA tenderness Neuro:             Awake, Alert, Oriented x 3  Other:     ED Results / Procedures / Treatments  Labs (all labs ordered are listed, but only abnormal results are displayed) Labs Reviewed  COMPREHENSIVE METABOLIC PANEL - Abnormal; Notable for the following components:      Result Value   Glucose, Bld 102 (*)  All other components within normal limits  CBC - Abnormal; Notable for the following components:   RBC 5.32 (*)    Hemoglobin 15.3 (*)    HCT 47.1 (*)    All other components within normal limits  URINALYSIS, ROUTINE W REFLEX MICROSCOPIC - Abnormal; Notable for the following components:   Color, Urine YELLOW (*)    APPearance CLEAR (*)    All other components within normal limits  LIPASE, BLOOD  PREGNANCY, URINE     EKG     RADIOLOGY I reviewed interpreted the CT of abdomen pelvis which is negative for acute process   PROCEDURES:  Critical Care performed: No  Procedures   MEDICATIONS ORDERED IN ED: Medications  lactated ringers bolus 1,000 mL (1,000 mLs Intravenous New Bag/Given 03/20/22 1159)  ketorolac (TORADOL) 15 MG/ML injection 15 mg (15 mg Intravenous Given 03/20/22 1200)  ondansetron (ZOFRAN) injection 4 mg (4 mg Intravenous Given 03/20/22 1200)  iohexol (OMNIPAQUE) 300 MG/ML solution 100 mL (100 mLs  Intravenous Contrast Given 03/20/22 1238)     IMPRESSION / MDM / ASSESSMENT AND PLAN / ED COURSE  I reviewed the triage vital signs and the nursing notes.                              Patient's presentation is most consistent with acute presentation with potential threat to life or bodily function.  Differential diagnosis includes, but is not limited to, appendicitis, kidney stone, pyelonephritis, cholecystitis/biliary colic, pancreatitis, hepatitis, GERD  Patient is a 36 year old female with history bariatric surgery presents with right lower quadrant right flank pain that started this morning.  This is associate with vomiting and diarrhea.  No urinary symptoms.  Patient is mildly tachycardic on arrival.  Overall she looks well she is tender both in the right lower quadrant and right upper quadrant but is more so in the right lower quadrant.  There is no guarding.  She also has right CVA tenderness.  Labs overall reassuring there is no leukocytosis UA without hematuria or pyuria making kidney stone and pyeloless likely.  Given the right lower quadrant tenderness we will start with a CT abdomen pelvis to rule out appendicitis.  This will also give [place] a look at the gallbladder.  Will give fluids Zofran Toradol.  CT is essentially negative.  Patient feeling improved after Toradol and Zofran.  Tolerating p.o.  Unclear what the etiology of her pain is but with reassuring labs and CT and feeling better think she is appropriate for discharge.  Discussed supportive care bowel rest and return precautions.       FINAL CLINICAL IMPRESSION(S) / ED DIAGNOSES   Final diagnoses:  Right flank pain     Rx / DC Orders   ED Discharge Orders     None        Note:  This document was prepared using Dragon voice recognition software and may include unintentional dictation errors.   Rada Hay, MD 03/20/22 1340

## 2022-03-24 ENCOUNTER — Ambulatory Visit: Payer: BC Managed Care – PPO | Admitting: Obstetrics and Gynecology

## 2022-06-06 ENCOUNTER — Encounter: Payer: BC Managed Care – PPO | Attending: Family Medicine | Admitting: Registered"

## 2022-06-06 ENCOUNTER — Encounter: Payer: Self-pay | Admitting: Registered"

## 2022-06-06 DIAGNOSIS — E282 Polycystic ovarian syndrome: Secondary | ICD-10-CM | POA: Insufficient documentation

## 2022-06-06 DIAGNOSIS — Z713 Dietary counseling and surveillance: Secondary | ICD-10-CM | POA: Insufficient documentation

## 2022-06-06 NOTE — Patient Instructions (Addendum)
Breakfast: what to eat: You can reduce the amount of your protein in your protein drink. Plain whey protein, vegetable component (kale and spinach), 1/2 ripe banana, frozen berries, plain [place] yogurt, peanut butter if you want. How to eat: multitask - mediate while eating breakfast - do the raisin experiment. https://palousemindfulness.com/  Set alarm for Monday at 10 am for a self-check-in: use the visual for body cues. Decide if you would benefit from a snack. At end of day do self-reflection to see how that went.

## 2022-06-06 NOTE — Progress Notes (Signed)
Medical Nutrition Therapy  Appointment Start time:  [phone]  Appointment End time: 1505  Primary concerns today: PCOS  Referral diagnosis: E28.2 Preferred learning style: visual Learning readiness: ready  NUTRITION ASSESSMENT  Anthropometrics not assessed this visit  Clinical Medical Hx: 2018 Bariatric surgery, family history of T2D (father), mother had pre-diabetes Medications: reviewed Mounjaro Labs: 5.3% Notable Signs/Symptoms: hair grown treated with laser, hair loss, was   Lifestyle & Dietary Hx Pt reports PCOS diagnosis in her 41s, IUD removed in Sept through December had regular periods, discontinued metformin after more than a month due to GI issues. Pt states she worked with [facility] and completed 6 week program.  Eating schedule based around work schedule. Weekends no regular pattern. 7-8 am breakfast drinking protein shake on way to work; 1-2 pm lunch at work at desk while watching netflix on phone; dinner 5-6:30 pm may, eat while watching TV.  Pt reports does not have snacks often, may eat an orange or nuts. Pt reports she doesn't have hunger cues unless skips a meal. Pt states sometimes she feels irritable during the day.  Pt reports she has been taking Mounjaro on and off due to insurance coverage and has lost 10-20 lbs. Pt states she would like to take less medication. Pt states she is also considering trying to conceive.  Estimated daily fluid intake: 30-64 oz water Supplements: reviewed Sleep: 6-7 hrs restful sleep. Started using 'hatch" alarm clock with light/noise to help get out of bed. Stress / self-care: not assessed Current average weekly physical activity: not assessed  24-Hr Dietary Recall First Meal: Protein shake, 1/2 oats, 2 scoops chocolate pro powder, flaxseeds, 1/2 c vanilla [place] Yogurt, almond milk, spoonful of PB Snack: none Second Meal: tuna salad, celery Snack:  Third Meal: (kids meal) tacos, rice beans, 1/2 sweet/unsweet tea Snack:   Beverages: water, chamomile, 1/2 sweet/unsweet tea, coffee with creamer 1x/week, green tea  NUTRITION DIAGNOSIS  NB-1.1 Food and nutrition-related knowledge deficit As related to listening to body cues when deciding if snacks are helpful.  As evidenced by pt states not having hunger cues, no snacks but reports sxs that could use a snack to help modulate mood and energy.  NUTRITION INTERVENTION  Nutrition education (E-1) on the following topics:  PCOS pathophysiology Insulin resistance Components of dairy (lactose and protein) that can cause GI distress Purpose and timing of snacks Mindful eating / meditation  Handouts Provided Include  Intuitive Eating Scale  Learning Style & Readiness for Change Teaching method utilized: Visual & Auditory  Demonstrated degree of understanding via: Teach Back  Barriers to learning/adherence to lifestyle change: none  Goals Established by Pt Breakfast: what to eat: You can reduce the amount of your protein in your protein drink. Plain whey protein, vegetable component (kale and spinach), 1/2 ripe banana, frozen berries, plain [place] yogurt, peanut butter if you want. How to eat: multitask - mediate while eating breakfast - do the raisin experiment. https://palousemindfulness.com/  Set alarm for Monday at 10 am for a self-check-in: use the visual for body cues. Decide if you would benefit from a snack. At end of day do self-reflection to see how that went.  MONITORING & EVALUATION Dietary intake, listening to body cues, and PCOS sxs in 4-6 weeks.

## 2022-06-13 ENCOUNTER — Other Ambulatory Visit: Payer: Self-pay | Admitting: Psychiatry

## 2022-06-13 DIAGNOSIS — F5105 Insomnia due to other mental disorder: Secondary | ICD-10-CM

## 2022-06-17 ENCOUNTER — Other Ambulatory Visit: Payer: Self-pay

## 2022-06-17 DIAGNOSIS — F39 Unspecified mood [affective] disorder: Secondary | ICD-10-CM

## 2022-06-17 MED ORDER — BUPROPION HCL ER (XL) 150 MG PO TB24: 150.0000 mg | ORAL_TABLET | Freq: Every day | ORAL | 0 refills | Status: DC

## 2022-06-18 ENCOUNTER — Other Ambulatory Visit: Payer: Self-pay | Admitting: Psychiatry

## 2022-06-18 DIAGNOSIS — F419 Anxiety disorder, unspecified: Secondary | ICD-10-CM

## 2022-06-29 IMAGING — CT CT ANGIO HEAD
1 of 11 series · 5 of 33 positions shown · IV contrast (omnipaque)
Comparison: None.

CLINICAL DATA: Initial evaluation for acute headache, dizziness.

EXAM:
CT ANGIOGRAPHY HEAD AND NECK
TECHNIQUE: Multidetector CT imaging of the head and neck was performed using
the standard protocol during bolus administration of intravenous
contrast. Multiplanar CT image reconstructions and MIPs were
obtained to evaluate the vascular anatomy. Carotid stenosis
measurements (when applicable) are obtained utilizing NASCET
criteria, using the distal internal carotid diameter as the
denominator.
CONTRAST:  75mL OMNIPAQUE IOHEXOL 350 MG/ML SOLN

[Series 13: axial thin · axial · 0.39mm/px · z∈[-294,-62]mm · 5 of 349 slices shown]
[im 59/349  soft-tissue]
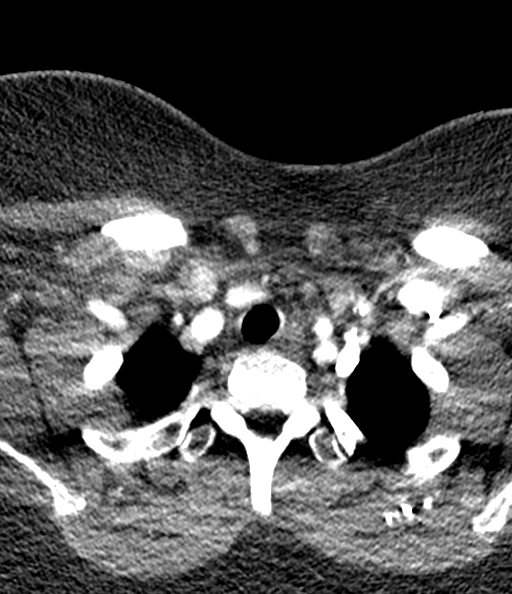
[im 117/349  bone]
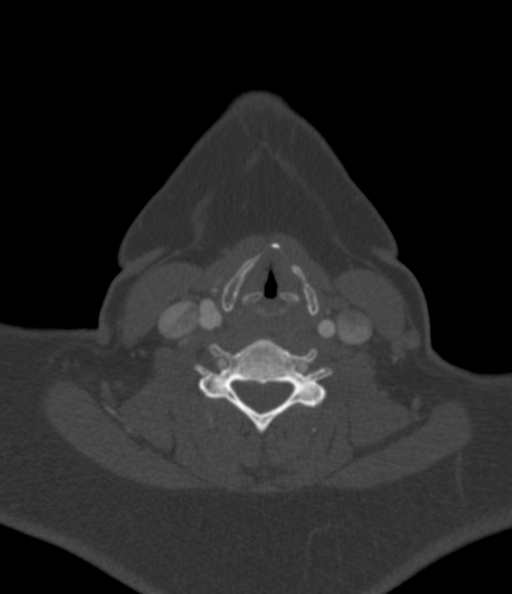
[im 175/349  soft-tissue]
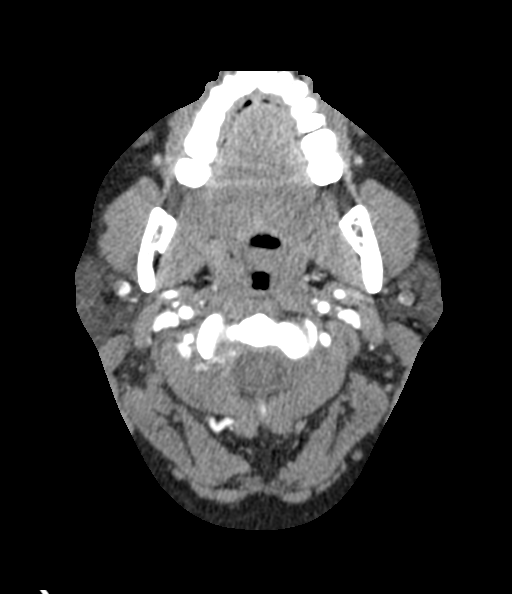
[im 233/349  bone]
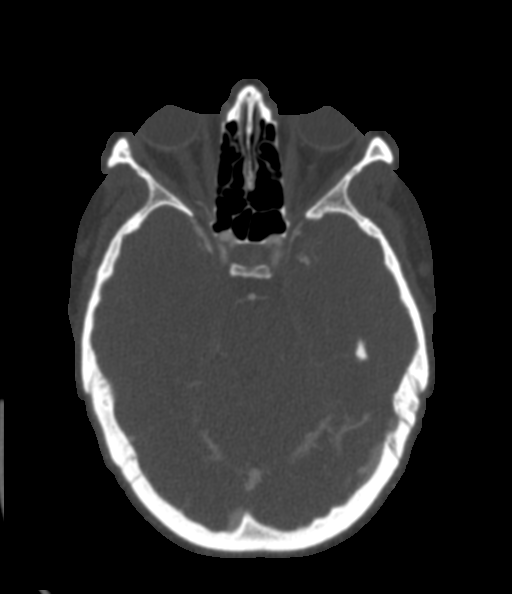
[im 291/349  soft-tissue]
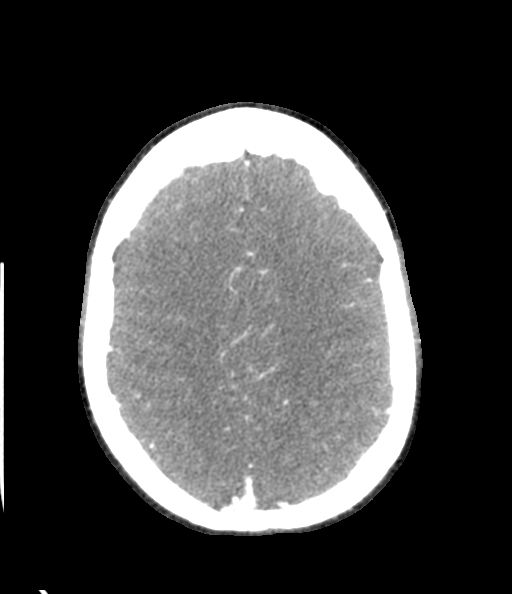

[5 of 33 positions shown; findings below may reference images not displayed]

FINDINGS: CT HEAD FINDINGS

Brain: Cerebral volume within normal limits for patient age.

No evidence for acute intracranial hemorrhage. No findings to
suggest acute large vessel territory infarct. No mass lesion,
midline shift, or mass effect. Ventricles are normal in size without
evidence for hydrocephalus. No extra-axial fluid collection
identified.

Vascular: No hyperdense vessel identified.

Skull: Scalp soft tissues demonstrate no acute abnormality.
Calvarium intact.

Sinuses/Orbits: Globes and orbital soft tissues within normal
limits.

Visualized paranasal sinuses are clear. No mastoid effusion.

CTA NECK FINDINGS

Aortic arch: Visualized aortic arch normal caliber with normal
branch pattern. No stenosis or other abnormality about the origin of
the great vessels.

Right carotid system: Right common and internal carotid arteries
widely patent without stenosis, dissection or occlusion.

Left carotid system: Left common and internal carotid arteries
widely patent without stenosis, dissection or occlusion.

Vertebral arteries: Both vertebral arteries arise from the
subclavian arteries. No proximal subclavian artery stenosis. Both
vertebral arteries widely patent without stenosis, dissection or
occlusion.

Skeleton: Unremarkable.

Other neck: Unremarkable.

Upper chest: Unremarkable.

Review of the MIP images confirms the above findings

CTA HEAD FINDINGS

Anterior circulation: Both internal carotid arteries widely patent
to the termini without stenosis. A1 segments widely patent. Normal
anterior communicating artery complex. Both anterior cerebral
arteries widely patent to their distal aspects without stenosis. No
M1 stenosis or occlusion. Normal MCA bifurcations. Distal MCA
branches well perfused and symmetric.

Posterior circulation: Both V4 segments patent to the
vertebrobasilar junction without stenosis. Left PICA patent. Right
PICA not seen. Basilar widely patent to its distal aspect without
stenosis. Superior cerebellar arteries patent bilaterally. Both PCAs
primarily supplied via the basilar and are well perfused to there
distal aspects.

Venous sinuses: Grossly patent allowing for timing the contrast
bolus.

Anatomic variants: None significant.  No aneurysm.

Review of the MIP images confirms the above findings
IMPRESSION: 1. Normal CTA of the head and neck. No large vessel occlusion,
hemodynamically significant stenosis, or other acute vascular
abnormality.
2. No other acute intracranial abnormality.

## 2022-07-06 ENCOUNTER — Telehealth: Payer: BC Managed Care – PPO | Admitting: Registered"

## 2022-07-06 NOTE — Progress Notes (Deleted)
Goals Established by Pt last visit: Breakfast: what to eat: You can reduce the amount of your protein in your protein drink. Plain whey protein, vegetable component (kale and spinach), 1/2 ripe banana, frozen berries, plain [place] yogurt, peanut butter if you want. How to eat: multitask - mediate while eating breakfast - do the raisin experiment. https://palousemindfulness.com/   Set alarm for Monday at 10 am for a self-check-in: use the visual for body cues. Decide if you would benefit from a snack. At end of day do self-reflection to see how that went.  Medical Nutrition Therapy follow-up Appointment Start time: ***  Appointment End time: ***   Primary concerns today: PCOS  Referral diagnosis: E28.2 Preferred learning style: visual Learning readiness: ready   NUTRITION ASSESSMENT  Anthropometrics not assessed this visit   Clinical Medical Hx: 2018 Bariatric surgery, family history of T2D (father), mother had pre-diabetes Medications: reviewed Mounjaro Labs: 5.3% Notable Signs/Symptoms: hair grown treated with laser, hair loss, was    Lifestyle & Dietary Hx

## 2022-07-07 ENCOUNTER — Other Ambulatory Visit: Payer: Self-pay | Admitting: Psychiatry

## 2022-07-07 DIAGNOSIS — F5105 Insomnia due to other mental disorder: Secondary | ICD-10-CM

## 2022-07-29 ENCOUNTER — Encounter: Payer: Self-pay | Admitting: Psychiatry

## 2022-07-29 ENCOUNTER — Telehealth (INDEPENDENT_AMBULATORY_CARE_PROVIDER_SITE_OTHER): Payer: BC Managed Care – PPO | Admitting: Psychiatry

## 2022-07-29 DIAGNOSIS — F39 Unspecified mood [affective] disorder: Secondary | ICD-10-CM | POA: Diagnosis not present

## 2022-07-29 DIAGNOSIS — F419 Anxiety disorder, unspecified: Secondary | ICD-10-CM | POA: Diagnosis not present

## 2022-07-29 DIAGNOSIS — F5105 Insomnia due to other mental disorder: Secondary | ICD-10-CM

## 2022-07-29 MED ORDER — HYDROXYZINE PAMOATE 25 MG PO CAPS: 25.0000 mg | ORAL_CAPSULE | Freq: Every evening | ORAL | 1 refills | Status: DC | PRN

## 2022-07-29 MED ORDER — SERTRALINE HCL 50 MG PO TABS: 50.0000 mg | ORAL_TABLET | Freq: Every day | ORAL | 1 refills | Status: DC

## 2022-07-29 MED ORDER — BUPROPION HCL ER (XL) 300 MG PO TB24: 300.0000 mg | ORAL_TABLET | Freq: Every day | ORAL | 1 refills | Status: DC

## 2022-07-29 NOTE — Progress Notes (Signed)
Bridget Rich [id number] May 29, 1985 37 y.o.  Virtual Visit via Video Note  I connected with pt @ on 07/29/22 at 10:00 AM EDT by a video enabled telemedicine application and verified that I am speaking with the correct person using two identifiers.   I discussed the limitations of evaluation and management by telemedicine and the availability of in person appointments. The patient expressed understanding and agreed to proceed.  I discussed the assessment and treatment plan with the patient. The patient was provided an opportunity to ask questions and all were answered. The patient agreed with the plan and demonstrated an understanding of the instructions.   The patient was advised to call back or seek an in-person evaluation if the symptoms worsen or if the condition fails to improve as anticipated.  I provided 25 minutes of non-face-to-face time during this encounter.  The patient was located at work in [facility].  The provider was located at home.   Corie Chiquito, PMHNP   Subjective:   Patient ID:  Bridget Rich is a 37 y.o. (DOB Jan 03, 1986) female.  Chief Complaint:  Chief Complaint  Patient presents with   Other    Low energy and motivation    HPI Bridget Rich presents for follow-up of depression, anxiety, and insomnia.   She continues to travel for speech therapy. 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 She reports that she has been feeling "ok." She reports that she is no longer having difficulty getting to work. Working on sleep hygiene and bought a device that helps with light and noise. She reports that she wakes up between 2-5 am. She reports that she may wake up several times and is able to return to sleep easily. She reports that her anxiety has been "ok" and manageable overall. She reports that she felt over-stimulated and anxious recently while traveling and being in a loud,  crowded area. She uses noise cancelling earbuds to reduce sensory input at times. She reports that she is less active right now with being on a travel assignment. She reports that her energy has been slightly less recently with travel. Motivation has been good for work and often gets to work early. She reports that her motivation for other things "comes in waves." Concentration has been "ok." She reports difficulty at times with organization and getting things done. She reports that she has had to create coping strategies and systems. Denies severe irritability unless overly stimulated. She reports occ mild sad mood. Denies risky behavior. She reports some impulsive shopping on occasion and reports that overall impulsive shopping has significantly improved. Denies ETOH use or other impulsive behaviors. Denies elevated moods. Appetite has been "fine." Denies SI.   Past Psychiatric Medication Trials: Trileptal Prozac- Tolerated at 10 mg po qd. More anxious at 20 mg. Took for awhile. wellbutrin Sertraline Rexulti Abilify- helped initially and then had concentration issues Latuda- Akathisia Klonopin  Review of Systems:  Review of Systems  Genitourinary:  Negative for menstrual problem.  Musculoskeletal:  Positive for arthralgias. Negative for gait problem.  Neurological:  Negative for tremors.  Psychiatric/Behavioral:         Please refer to HPI    Medications: I have reviewed the patient's current medications.  Current Outpatient Medications  Medication Sig Dispense Refill   Barberry-Oreg Grape-Goldenseal (BERBERINE COMPLEX PO) Take by mouth.     Calcium Carbonate (CALCIUM 600 PO) Take by mouth 2 (  two) times daily.     cyanocobalamin 1000 MCG tablet Take 1,000 mcg by mouth daily.     Inositol-D Chiro-Inositol 2000-50 MG PACK Take by mouth. Taking for PCOS.     MOUNJARO 12.5 MG/0.5ML Pen Inject 12.5 mg into the skin once a week.     Omega-3 Fatty Acids (FISH OIL) 1360 MG CAPS Take by mouth.      Prenatal Vit-Fe Fumarate-FA (PRENATAL PO) Take by mouth.     vitamin C (ASCORBIC ACID) 500 MG tablet Take 500 mg by mouth daily.     buPROPion (WELLBUTRIN XL) 300 MG 24 hr tablet Take 1 tablet (300 mg total) by mouth daily. 90 tablet 1   clonazePAM (KLONOPIN) 0.5 MG tablet TAKE 1-2 TABLETS BY MOUTH AT BEDTIME AS NEEDED (Patient not taking: Reported on 07/29/2022) 30 tablet 0   hydrOXYzine (VISTARIL) 25 MG capsule Take 1 capsule (25 mg total) by mouth at bedtime as needed (Insomnia). 90 capsule 1   medroxyPROGESTERone (PROVERA) 5 MG tablet Take one tablet po qd x 5 days every other month if no spontaneous menses. You can increase to every month if needed. (Patient not taking: Reported on 02/10/2022) 15 tablet 3   ondansetron (ZOFRAN-ODT) 4 MG disintegrating tablet Take 1 tablet (4 mg total) by mouth every 8 (eight) hours as needed for nausea or vomiting. (Patient not taking: Reported on 02/10/2022) 20 tablet 0   sertraline (ZOLOFT) 50 MG tablet Take 1 tablet (50 mg total) by mouth daily. 90 tablet 1   No current facility-administered medications for this visit.    Medication Side Effects: None  Allergies:  Allergies  Allergen Reactions   Augmentin [Amoxicillin-Pot Clavulanate] Other (See Comments)    unknown   Ceclor [Cefaclor] Other (See Comments)    unknown   Other Other (See Comments)    IBU - s/p bariatric surgery    Past Medical History:  Diagnosis Date   Chicken pox    Depression with anxiety    GERD (gastroesophageal reflux disease)    IUD (intrauterine device) in place    PCOS (polycystic ovarian syndrome)     Family History  Problem Relation Age of Onset   Diabetes Father    Depression Father    Hypertension Father    Hyperlipidemia Father    Mental illness Father    Anxiety disorder Sister    Depression Maternal Grandfather    Diabetes Maternal Grandfather    Hearing loss Maternal Grandfather    Heart disease Maternal Grandfather    Kidney disease Maternal  Grandfather    Mental illness Maternal Grandfather    Depression Maternal Grandmother    Diabetes Maternal Grandmother    COPD Paternal Grandfather    Alcohol abuse Paternal Grandfather    Asthma Paternal Grandfather    Diabetes Paternal Grandfather    Heart disease Paternal Grandfather    Hyperlipidemia Paternal Grandfather    Hypertension Paternal Grandfather    Bipolar disorder Cousin    Anxiety disorder Sister    Hypertension Mother    Lung cancer Paternal Grandmother    Depression Paternal Grandmother     Social History   Socioeconomic History   Marital status: Married    Spouse name: Not on file   Number of children: Not on file   Years of education: Not on file   Highest education level: Not on file  Occupational History   Not on file  Tobacco Use   Smoking status: Never   Smokeless tobacco: Never  Vaping  Use   Vaping Use: Never used  Substance and Sexual Activity   Alcohol use: Yes    Comment: Occasionally 6-10 over a month    Drug use: No   Sexual activity: Yes    Partners: Male    Comment: iud  Other Topics Concern   Not on file  Social History Narrative   Marital status/children/pets: single   Education/employment: Master's degree, Warehouse manager.    Safety:      -Wears a bicycle helmet riding a bike: Yes     -smoke alarm in the home:Yes     - wears seatbelt: Yes     - Feels safe in their relationships: Yes   Social Determinants of Health   Financial Resource Strain: Not on file  Food Insecurity: No Food Insecurity (06/06/2022)   Hunger Vital Sign    Worried About Running Out of Food in the Last Year: Never true    Ran Out of Food in the Last Year: Never true  Transportation Needs: Not on file  Physical Activity: Not on file  Stress: Not on file  Social Connections: Not on file  Intimate Partner Violence: Not on file    Past Medical History, Surgical history, Social history, and Family history were reviewed and updated as  appropriate.   Please see review of systems for further details on the patient's review from today.   Objective:   Physical Exam:  There were no vitals taken for this visit.  Physical Exam  Lab Review:     Component Value Date/Time   NA 137 03/20/2022 0918   NA 140 01/31/2022 1554   K 4.5 03/20/2022 0918   CL 104 03/20/2022 0918   CO2 24 03/20/2022 0918   GLUCOSE 102 (H) 03/20/2022 0918   BUN 17 03/20/2022 0918   BUN 9 01/31/2022 1554   CREATININE 0.84 03/20/2022 0918   CALCIUM 9.4 03/20/2022 0918   PROT 8.1 03/20/2022 0918   PROT 6.7 01/31/2022 1554   ALBUMIN 4.4 03/20/2022 0918   ALBUMIN 4.4 01/31/2022 1554   AST 20 03/20/2022 0918   ALT 20 03/20/2022 0918   ALKPHOS 44 03/20/2022 0918   BILITOT 0.9 03/20/2022 0918   BILITOT 0.2 01/31/2022 1554   GFRNONAA >60 03/20/2022 0918   GFRAA >60 11/20/2014 0041       Component Value Date/Time   WBC 10.2 03/20/2022 0918   RBC 5.32 (H) 03/20/2022 0918   HGB 15.3 (H) 03/20/2022 0918   HGB 14.1 01/31/2022 1554   HCT 47.1 (H) 03/20/2022 0918   HCT 41.8 01/31/2022 1554   PLT 254 03/20/2022 0918   PLT 275 01/31/2022 1554   MCV 88.5 03/20/2022 0918   MCV 88 01/31/2022 1554   MCH 28.8 03/20/2022 0918   MCHC 32.5 03/20/2022 0918   RDW 12.7 03/20/2022 0918   RDW 12.6 01/31/2022 1554   LYMPHSABS 4.0 09/07/2020 2343   MONOABS 0.9 09/07/2020 2343   EOSABS 0.2 09/07/2020 2343   BASOSABS 0.1 09/07/2020 2343    No results found for: "POCLITH", "LITHIUM"   No results found for: "PHENYTOIN", "PHENOBARB", "VALPROATE", "CBMZ"   .res Assessment: Plan:    I spent 30 minutes dedicated to the care of this patient on the date of this  encounter to include pre-visit review of records, face-to-face time with the patient discussing depression, and post visit documentation. Discussed potential benefits, risks, and side effects of increasing Wellbutrin XL to 300 mg po qd to improve energy, motivation, and concentration. Pt agrees  to  increase in Wellbutrin XL to 300 mg daily.  Continue Sertraline 50 mg po qd for anxiety and depression.  Continue Hydroxyzine 25 mg po QHS prn insomnia.  Pt to follow-up in 6 months or sooner if clinically indicated.  Patient advised to contact office with any questions, adverse effects, or acute worsening in signs and symptoms.   Gyanna was seen today for other.  Diagnoses and all orders for this visit:  Episodic mood disorder (HCC) -     buPROPion (WELLBUTRIN XL) 300 MG 24 hr tablet; Take 1 tablet (300 mg total) by mouth daily. -     sertraline (ZOLOFT) 50 MG tablet; Take 1 tablet (50 mg total) by mouth daily.  Anxiety disorder, unspecified type -     sertraline (ZOLOFT) 50 MG tablet; Take 1 tablet (50 mg total) by mouth daily.  Insomnia secondary to anxiety -     hydrOXYzine (VISTARIL) 25 MG capsule; Take 1 capsule (25 mg total) by mouth at bedtime as needed (Insomnia).     Please see After Visit Summary for patient specific instructions.  No future appointments.   No orders of the defined types were placed in this encounter.     -------------------------------

## 2022-09-21 ENCOUNTER — Ambulatory Visit: Payer: BC Managed Care – PPO | Admitting: Obstetrics and Gynecology

## 2022-10-10 ENCOUNTER — Other Ambulatory Visit: Payer: Self-pay | Admitting: Psychiatry

## 2022-10-10 DIAGNOSIS — F39 Unspecified mood [affective] disorder: Secondary | ICD-10-CM

## 2022-10-10 DIAGNOSIS — F419 Anxiety disorder, unspecified: Secondary | ICD-10-CM

## 2022-10-10 NOTE — Telephone Encounter (Signed)
Which pharmacy?

## 2022-12-30 ENCOUNTER — Telehealth: Payer: BC Managed Care – PPO | Admitting: Psychiatry

## 2022-12-30 DIAGNOSIS — Z91199 Patient's noncompliance with other medical treatment and regimen due to unspecified reason: Secondary | ICD-10-CM
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
## 2023-01-09 ENCOUNTER — Telehealth: Payer: BC Managed Care – PPO | Admitting: Psychiatry

## 2023-01-09 ENCOUNTER — Encounter: Payer: Self-pay | Admitting: Psychiatry

## 2023-01-09 DIAGNOSIS — F5105 Insomnia due to other mental disorder: Secondary | ICD-10-CM | POA: Diagnosis not present

## 2023-01-09 DIAGNOSIS — F39 Unspecified mood [affective] disorder: Secondary | ICD-10-CM

## 2023-01-09 DIAGNOSIS — F419 Anxiety disorder, unspecified: Secondary | ICD-10-CM

## 2023-01-09 MED ORDER — SERTRALINE HCL 100 MG PO TABS
100.0000 mg | ORAL_TABLET | Freq: Every day | ORAL | 1 refills | Status: DC
Start: 2023-01-09 — End: 2023-02-03

## 2023-01-09 MED ORDER — BUPROPION HCL ER (XL) 300 MG PO TB24
300.0000 mg | ORAL_TABLET | Freq: Every day | ORAL | 1 refills | Status: DC
Start: 2023-01-09 — End: 2023-02-03

## 2023-01-09 MED ORDER — HYDROXYZINE PAMOATE 25 MG PO CAPS
25.0000 mg | ORAL_CAPSULE | Freq: Every evening | ORAL | 1 refills | Status: DC | PRN
Start: 2023-01-09 — End: 2023-02-03
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
## 2023-02-03 ENCOUNTER — Encounter: Payer: Self-pay | Admitting: Psychiatry

## 2023-02-03 ENCOUNTER — Telehealth (INDEPENDENT_AMBULATORY_CARE_PROVIDER_SITE_OTHER): Payer: BC Managed Care – PPO | Admitting: Psychiatry

## 2023-02-03 DIAGNOSIS — F419 Anxiety disorder, unspecified: Secondary | ICD-10-CM

## 2023-02-03 DIAGNOSIS — F39 Unspecified mood [affective] disorder: Secondary | ICD-10-CM | POA: Diagnosis not present

## 2023-02-03 DIAGNOSIS — F5105 Insomnia due to other mental disorder: Secondary | ICD-10-CM | POA: Diagnosis not present

## 2023-02-03 MED ORDER — BUPROPION HCL ER (XL) 300 MG PO TB24
300.0000 mg | ORAL_TABLET | Freq: Every day | ORAL | 1 refills | Status: AC
Start: 2023-02-03 — End: 2023-05-04

## 2023-02-03 MED ORDER — HYDROXYZINE PAMOATE 25 MG PO CAPS
25.0000 mg | ORAL_CAPSULE | Freq: Every evening | ORAL | 1 refills | Status: AC | PRN
Start: 2023-02-03 — End: ?

## 2023-02-03 MED ORDER — SERTRALINE HCL 100 MG PO TABS
100.0000 mg | ORAL_TABLET | Freq: Every day | ORAL | 1 refills | Status: AC
Start: 2023-02-03 — End: 2023-05-04

## 2023-02-03 NOTE — Progress Notes (Signed)
Bridget Rich [id number] 1986-02-15 37 y.o.  Virtual Visit via Video Note  I connected with pt @ on 02/03/23 at 10:00 AM EDT by a video enabled telemedicine application and verified that I am speaking with the correct person using two identifiers.   I discussed the limitations of evaluation and management by telemedicine and the availability of in person appointments. The patient expressed understanding and agreed to proceed.  I discussed the assessment and treatment plan with the patient. The patient was provided an opportunity to ask questions and all were answered. The patient agreed with the plan and demonstrated an understanding of the instructions.   The patient was advised to call back or seek an in-person evaluation if the symptoms worsen or if the condition fails to improve as anticipated.  I provided 14 minutes of non-face-to-face time during this encounter.  The patient was located in her personal vehicle in [place].  The provider was located at home.   Corie Chiquito, PMHNP   Subjective:   Patient ID:  Bridget Rich is a 37 y.o. (D OB 1986/02/03) female.  Chief Complaint:  Chief Complaint  Patient presents with   Follow-up    Anxiety, depression    HPI Bridget Rich presents for follow-up of depression and anxiety. She reports that she has been well overall, except for situational stress.. She reports that anxiety has been improved. Less social anxiety. Denies panic. She reports improved mood- "I definitely feel a little bit lighter." She reports some possible mild depression in response to stressors. Some irritability with situational stressors. Energy and motivation have been "inconsistent," particularly at home. Motivation has been good at work. She was able to clean her house and pack yesterday. 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 She has not called out of work recently. Sleeping well at home with her husband. Reports not sleeping as well when traveling  for work. Appetite has been fine. She denies any difficulty with concentration. Denies SI.   Has been staying on campus   Past Psychiatric Medication Trials: Trileptal Prozac- Tolerated at 10 mg po qd. More anxious at 20 mg. Took for awhile. wellbutrin Sertraline Rexulti Abilify- helped initially and then had concentration issues Latuda- Akathisia Klonopin  Review of Systems:  Review of Systems  Musculoskeletal:  Negative for gait problem.  Neurological:  Negative for headaches.  Psychiatric/Behavioral:         Please refer to HPI    Medications: I have reviewed the patient's current medications.  Current Outpatient Medications  Medication Sig Dispense Refill   Barberry-Oreg Grape-Goldenseal (BERBERINE COMPLEX PO) Take by mouth.     Calcium Carbonate (CALCIUM 600 PO) Take by mouth 2 (two) times daily.     cyanocobalamin 1000 MCG tablet Take 1,000 mcg by mouth daily.     Inositol-D Chiro-Inositol 2000-50 MG PACK Take by mouth. Taking for PCOS.     Omega-3 Fatty Acids (FISH OIL) 1360 MG CAPS Take by mouth.     Prenatal Vit-Fe Fumarate-FA (PRENATAL PO) Take by mouth.     vitamin C (ASCORBIC ACID) 500 MG tablet Take 500 mg by mouth daily.     buPROPion (WELLBUTRIN XL) 300 MG 24 hr tablet Take 1 tablet (300 mg total) by mouth daily. 90 tablet 1   clonazePAM (KLONOPIN) 0.5 MG tablet TAKE 1-2 TABLETS BY MOUTH AT BEDTIME AS NEEDED (Patient not taking: Reported on 07/29/2022) 30 tablet 0   hydrOXYzine (VISTARIL) 25 MG capsule Take 1 capsule (25 mg total) by mouth at  bedtime as needed (Insomnia). 90 capsule 1   medroxyPROGESTERone (PROVERA) 5 MG tablet Take one tablet po qd x 5 days every other month if no spontaneous menses. You can increase to every month if needed. (Patient not taking: Reported on 02/10/2022) 15 tablet 3   sertraline (ZOLOFT) 100 MG tablet Take 1 tablet (100 mg total) by mouth daily. 90 tablet 1   No current facility-administered medications for this visit.     Medication Side Effects: None  Allergies:  Allergies  Allergen Reactions   Augmentin [Amoxicillin-Pot Clavulanate] Other (See Comments)    unknown   Ceclor [Cefaclor] Other (See Comments)    unknown   Other Other (See Comments)    IBU - s/p bariatric surgery    Past Medical History:  Diagnosis Date   Chicken pox    Depression with anxiety    GERD (gastroesophageal reflux disease)    IUD (intrauterine device) in place    PCOS (polycystic ovarian syndrome)     Family History  Problem Relation Age of Onset   Diabetes Father    Depression Father    Hypertension Father    Hyperlipidemia Father    Mental illness Father    Anxiety disorder Sister    Depression Maternal Grandfather    Diabetes Maternal Grandfather    Hearing loss Maternal Grandfather    Heart disease Maternal Grandfather    Kidney disease Maternal Grandfather    Mental illness Maternal Grandfather    Depression Maternal Grandmother    Diabetes Maternal Grandmother    COPD Paternal Grandfather    Alcohol abuse Paternal Grandfather    Asthma Paternal Grandfather    Diabetes Paternal Grandfather    Heart disease Paternal Grandfather    Hyperlipidemia Paternal Grandfather    Hypertension Paternal Grandfather    Bipolar disorder Cousin    Anxiety disorder Sister    Hypertension Mother    Lung cancer Paternal Grandmother    Depression Paternal Grandmother     Social History   Socioeconomic History   Marital status: Married    Spouse name: Not on file   Number of children: Not on file   Years of education: Not on file   Highest education level: Not on file  Occupational History   Not on file  Tobacco Use   Smoking status: Never   Smokeless tobacco: Never  Vaping Use   Vaping status: Never Used  Substance and Sexual Activity   Alcohol use: Yes    Comment: Occasionally 6-10 over a month    Drug use: No   Sexual activity: Yes    Partners: Male    Comment: iud  Other Topics Concern   Not  on file  Social History Narrative   Marital status/children/pets: single   Education/employment: Master's degree, Warehouse manager.    Safety:      -Wears a bicycle helmet riding a bike: Yes     -smoke alarm in the home:Yes     - wears seatbelt: Yes     - Feels safe in their relationships: Yes   Social Determinants of Health   Financial Resource Strain: Not on file  Food Insecurity: No Food Insecurity (06/06/2022)   Hunger Vital Sign    Worried About Running Out of Food in the Last Year: Never true    Ran Out of Food in the Last Year: Never true  Transportation Needs: Not on file  Physical Activity: Not on file  Stress: Not on file  Social Connections: Unknown (  08/03/2021)   Received from [facility], [facility]   Social Network    Social Network: Not on file  Intimate Partner Violence: Unknown (07/08/2021)   Received from [facility], [facility]   HITS    Physically Hurt: Not on file    Insult or Talk Down To: Not on file    Threaten Physical Harm: Not on file    Scream or Curse: Not on file    Past Medical History, Surgical history, Social history, and Family history were reviewed and updated as appropriate.   Please see review of systems for further details on the patient's review from today.   Objective:   Physical Exam:  There were no vitals taken for this visit.  Physical Exam Neurological:     Mental Status: She is alert and oriented to person, place, and time.     Cranial Nerves: No dysarthria.  Psychiatric:        Attention and Perception: Attention and perception normal.        Speech: Speech normal.        Behavior: Behavior is cooperative.        Thought Content: Thought content normal. Thought content is not paranoid or delusional. Thought content does not include homicidal or suicidal ideation. Thought content does not include homicidal or suicidal plan.        Cognition and Memory: Cognition and memory normal.        Judgment: Judgment  normal.     Comments: Insight intact Mood is appropriate to content and affect is congruent     Lab Review:     Component Value Date/Time   NA 137 03/20/2022 0918   NA 140 01/31/2022 1554   K 4.5 03/20/2022 0918   CL 104 03/20/2022 0918   CO2 24 03/20/2022 0918   GLUCOSE 102 (H) 03/20/2022 0918   BUN 17 03/20/2022 0918   BUN 9 01/31/2022 1554   CREATININE 0.84 03/20/2022 0918   CALCIUM 9.4 03/20/2022 0918   PROT 8.1 03/20/2022 0918   PROT 6.7 01/31/2022 1554   ALBUMIN 4.4 03/20/2022 0918   ALBUMIN 4.4 01/31/2022 1554   AST 20 03/20/2022 0918   ALT 20 03/20/2022 0918   ALKPHOS 44 03/20/2022 0918   BILITOT 0.9 03/20/2022 0918   BILITOT 0.2 01/31/2022 1554   GFRNONAA >60 03/20/2022 0918   GFRAA >60 11/20/2014 0041       Component Value Date/Time   WBC 10.2 03/20/2022 0918   RBC 5.32 (H) 03/20/2022 0918   HGB 15.3 (H) 03/20/2022 0918   HGB 14.1 01/31/2022 1554   HCT 47.1 (H) 03/20/2022 0918   HCT 41.8 01/31/2022 1554   PLT 254 03/20/2022 0918   PLT 275 01/31/2022 1554   MCV 88.5 03/20/2022 0918   MCV 88 01/31/2022 1554   MCH 28.8 03/20/2022 0918   MCHC 32.5 03/20/2022 0918   RDW 12.7 03/20/2022 0918   RDW 12.6 01/31/2022 1554   LYMPHSABS 4.0 09/07/2020 2343   MONOABS 0.9 09/07/2020 2343   EOSABS 0.2 09/07/2020 2343   BASOSABS 0.1 09/07/2020 2343    No results found for: "POCLITH", "LITHIUM"   No results found for: "PHENYTOIN", "PHENOBARB", "VALPROATE", "CBMZ"   .res Assessment: Plan:   Will continue current plan of care since target signs and symptoms are well controlled without any tolerability issues. Continue Wellbutrin XL 300 mg daily for depression. Continue Sertraline 100 mg daily for anxiety and depression. Continue Hydroxyzine 25 mg po at bedtime as needed for  insomnia.  Pt to follow-up in 3 months or sooner if clinically indicated.  Patient advised to contact office with any questions, adverse effects, or acute worsening in signs and  symptoms.  Bridget Rich was seen today for follow-up.  Diagnoses and all orders for this visit:  Episodic mood disorder (HCC) -     buPROPion (WELLBUTRIN XL) 300 MG 24 hr tablet; Take 1 tablet (300 mg total) by mouth daily. -     sertraline (ZOLOFT) 100 MG tablet; Take 1 tablet (100 mg total) by mouth daily.  Insomnia secondary to anxiety -     hydrOXYzine (VISTARIL) 25 MG capsule; Take 1 capsule (25 mg total) by mouth at bedtime as needed (Insomnia).  Anxiety disorder, unspecified type -     sertraline (ZOLOFT) 100 MG tablet; Take 1 tablet (100 mg total) by mouth daily.     Please see After Visit Summary for patient specific instructions.  No future appointments.   No orders of the defined types were placed in this encounter.     -------------------------------

## 2023-02-15 ENCOUNTER — Encounter: Payer: Self-pay | Admitting: Psychiatry

## 2023-03-13 ENCOUNTER — Encounter: Payer: Self-pay | Admitting: Psychiatry

## 2023-03-13 ENCOUNTER — Telehealth (INDEPENDENT_AMBULATORY_CARE_PROVIDER_SITE_OTHER): Payer: BC Managed Care – PPO | Admitting: Psychiatry

## 2023-03-13 VITALS — HR 69

## 2023-03-13 DIAGNOSIS — F50819 Binge eating disorder, unspecified: Secondary | ICD-10-CM | POA: Diagnosis not present

## 2023-03-13 DIAGNOSIS — F902 Attention-deficit hyperactivity disorder, combined type: Secondary | ICD-10-CM | POA: Diagnosis not present

## 2023-03-13 MED ORDER — LISDEXAMFETAMINE DIMESYLATE 40 MG PO CAPS
40.0000 mg | ORAL_CAPSULE | ORAL | 0 refills | Status: AC
Start: 2023-03-13 — End: ?

## 2023-03-13 NOTE — Progress Notes (Unsigned)
Bridget Rich [id number] May 20, 1985 37 y.o.  Virtual Visit via Video Note  I connected with pt @ on 03/13/23 at  9:00 AM EST by a video enabled telemedicine application and verified that I am speaking with the correct person using two identifiers.   I discussed the limitations of evaluation and management by telemedicine and the availability of in person appointments. The patient expressed understanding and agreed to proceed.  I discussed the assessment and treatment plan with the patient. The patient was provided an opportunity to ask questions and all were answered. The patient agreed with the plan and demonstrated an understanding of the instructions.   The patient was advised to call back or seek an in-person evaluation if the symptoms worsen or if the condition fails to improve as anticipated.  I provided 30 minutes of non-face-to-face time during this encounter.  The patient was located at home.  The provider was located at [facility].   Corie Chiquito, PMHNP   Subjective:   Patient ID:  Bridget Rich is a 37 y.o. (DOB [birth year]) female.  Chief Complaint:  Chief Complaint  Patient presents with   Other    Concentration difficulties    HPI Bridget Rich presents for follow-up of Depression and anxiety. She reports difficulty with concentration and focus. Frequently losing and misplacing things- has had to do things again at work that have been lost. She reports increased difficulty now that she has a job that is less structured. She reports difficulty sitting still- frequently fidgets, has been trying to limit the times she goes to her boss's office. Reports lifelong hyperactivity and reports her nickname in daycare was "spastic." Starting tasks and not completing them and then starting another task. She reports she is always late, even after being very intentional about being on time.   She reports depression has "been a lot better." She reports anxiety has  improved. She reports less episodes of irritability. She reports that her energy and motivation is "very inconsistent." She reports some days are more productive than others. Appetite has been "fine." She reports that bariatric surgery has limited binge eating. She reports adequate sleep. She is takes Hydroxyzine prn. Denies SI.   She and her husband are going to [place] for Christmas.   Plans to go to an OA group. Seeing a therapist.   She reports limited friends locally. She reports that she is no longer traveling for work. She does not have a vehicle currently.   Reports father has ADHD symptoms.   She typically works four 10-hour shifts.    Past Psychiatric Medication Trials: Trileptal Prozac- Tolerated at 10 mg po qd. More anxious at 20 mg. Took for awhile. wellbutrin Sertraline Rexulti Abilify- helped initially and then had concentration issues Latuda- Akathisia Klonopin  Review of Systems:  Review of Systems  Cardiovascular:  Negative for palpitations.  Musculoskeletal:  Negative for gait problem.  Neurological:  Negative for tremors.  Psychiatric/Behavioral:         Please refer to HPI    Medications: I have reviewed the patient's current medications.  Current Outpatient Medications  Medication Sig Dispense Refill   Barberry-Oreg Grape-Goldenseal (BERBERINE COMPLEX PO) Take by mouth.     buPROPion (WELLBUTRIN XL) 300 MG 24 hr tablet Take 1 tablet (300 mg total) by mouth daily. 90 tablet 1   Inositol-D Chiro-Inositol 2000-50 MG PACK Take by mouth. Taking for PCOS.     lisdexamfetamine (VYVANSE) 40 MG capsule Take 1 capsule (40 mg total)  by mouth every morning. 30 capsule 0   Omega-3 Fatty Acids (FISH OIL) 1360 MG CAPS Take by mouth.     Prenatal Vit-Fe Fumarate-FA (PRENATAL PO) Take by mouth.     sertraline (ZOLOFT) 100 MG tablet Take 1 tablet (100 mg total) by mouth daily. 90 tablet 1   vitamin C (ASCORBIC ACID) 500 MG tablet Take 500 mg by mouth daily.     Calcium  Carbonate (CALCIUM 600 PO) Take by mouth 2 (two) times daily.     clonazePAM (KLONOPIN) 0.5 MG tablet TAKE 1-2 TABLETS BY MOUTH AT BEDTIME AS NEEDED (Patient not taking: Reported on 07/29/2022) 30 tablet 0   cyanocobalamin 1000 MCG tablet Take 1,000 mcg by mouth daily.     hydrOXYzine (VISTARIL) 25 MG capsule Take 1 capsule (25 mg total) by mouth at bedtime as needed (Insomnia). 90 capsule 1   medroxyPROGESTERone (PROVERA) 5 MG tablet Take one tablet po qd x 5 days every other month if no spontaneous menses. You can increase to every month if needed. (Patient not taking: Reported on 02/10/2022) 15 tablet 3   No current facility-administered medications for this visit.    Medication Side Effects: None  Allergies:  Allergies  Allergen Reactions   Augmentin [Amoxicillin-Pot Clavulanate] Other (See Comments)    unknown   Ceclor [Cefaclor] Other (See Comments)    unknown   Other Other (See Comments)    IBU - s/p bariatric surgery    Past Medical History:  Diagnosis Date   Chicken pox    Depression with anxiety    GERD (gastroesophageal reflux disease)    IUD (intrauterine device) in place    PCOS (polycystic ovarian syndrome)     Family History  Problem Relation Age of Onset   Diabetes Father    Depression Father    Hypertension Father    Hyperlipidemia Father    Mental illness Father    Anxiety disorder Sister    Depression Maternal Grandfather    Diabetes Maternal Grandfather    Hearing loss Maternal Grandfather    Heart disease Maternal Grandfather    Kidney disease Maternal Grandfather    Mental illness Maternal Grandfather    Depression Maternal Grandmother    Diabetes Maternal Grandmother    COPD Paternal Grandfather    Alcohol abuse Paternal Grandfather    Asthma Paternal Grandfather    Diabetes Paternal Grandfather    Heart disease Paternal Grandfather    Hyperlipidemia Paternal Grandfather    Hypertension Paternal Grandfather    Bipolar disorder Cousin     Anxiety disorder Sister    Hypertension Mother    Lung cancer Paternal Grandmother    Depression Paternal Grandmother     Social History   Socioeconomic History   Marital status: Married    Spouse name: Not on file   Number of children: Not on file   Years of education: Not on file   Highest education level: Not on file  Occupational History   Not on file  Tobacco Use   Smoking status: Never   Smokeless tobacco: Never  Vaping Use   Vaping status: Never Used  Substance and Sexual Activity   Alcohol use: Yes    Comment: Occasionally 6-10 over a month    Drug use: No   Sexual activity: Yes    Partners: Male    Comment: iud  Other Topics Concern   Not on file  Social History Narrative   Marital status/children/pets: single   Education/employment: Master's degree, speech  Solicitor.    Safety:      -Wears a bicycle helmet riding a bike: Yes     -smoke alarm in the home:Yes     - wears seatbelt: Yes     - Feels safe in their relationships: Yes   Social Determinants of Health   Financial Resource Strain: Not on file  Food Insecurity: Unknown (02/01/2023)   Received from [facility]   Hunger Vital Sign    Worried About Running Out of Food in the Last Year: Patient declined to answer    Ran Out of Food in the Last Year: Patient declined to answer  Transportation Needs: No Transportation Needs (02/01/2023)   Received from [facility]    In the past 12 months, has lack of reliable transportation kept you from medical appointments, meetings, work or from getting things needed for daily living? : No  Physical Activity: Not on file  Stress: Not on file  Social Connections: Unknown (08/03/2021)   Received from [facility], [facility]   Social Network    Social Network: Not on file  Intimate Partner Violence: Unknown (07/08/2021)   Received from [facility], [facility]   HITS    Physically Hurt: Not on file    Insult or Talk Down To:  Not on file    Threaten Physical Harm: Not on file    Scream or Curse: Not on file    Past Medical History, Surgical history, Social history, and Family history were reviewed and updated as appropriate.   Please see review of systems for further details on the patient's review from today.   Objective:   Physical Exam:  Pulse 69   Physical Exam  Lab Review:     Component Value Date/Time   NA 137 03/20/2022 0918   NA 140 01/31/2022 1554   K 4.5 03/20/2022 0918   CL 104 03/20/2022 0918   CO2 24 03/20/2022 0918   GLUCOSE 102 (H) 03/20/2022 0918   BUN 17 03/20/2022 0918   BUN 9 01/31/2022 1554   CREATININE 0.84 03/20/2022 0918   CALCIUM 9.4 03/20/2022 0918   PROT 8.1 03/20/2022 0918   PROT 6.7 01/31/2022 1554   ALBUMIN 4.4 03/20/2022 0918   ALBUMIN 4.4 01/31/2022 1554   AST 20 03/20/2022 0918   ALT 20 03/20/2022 0918   ALKPHOS 44 03/20/2022 0918   BILITOT 0.9 03/20/2022 0918   BILITOT 0.2 01/31/2022 1554   GFRNONAA >60 03/20/2022 0918   GFRAA >60 11/20/2014 0041       Component Value Date/Time   WBC 10.2 03/20/2022 0918   RBC 5.32 (H) 03/20/2022 0918   HGB 15.3 (H) 03/20/2022 0918   HGB 14.1 01/31/2022 1554   HCT 47.1 (H) 03/20/2022 0918   HCT 41.8 01/31/2022 1554   PLT 254 03/20/2022 0918   PLT 275 01/31/2022 1554   MCV 88.5 03/20/2022 0918   MCV 88 01/31/2022 1554   MCH 28.8 03/20/2022 0918   MCHC 32.5 03/20/2022 0918   RDW 12.7 03/20/2022 0918   RDW 12.6 01/31/2022 1554   LYMPHSABS 4.0 09/07/2020 2343   MONOABS 0.9 09/07/2020 2343   EOSABS 0.2 09/07/2020 2343   BASOSABS 0.1 09/07/2020 2343    No results found for: "POCLITH", "LITHIUM"   No results found for: "PHENYTOIN", "PHENOBARB", "VALPROATE", "CBMZ"   .res Assessment: Plan:    35 minutes spent dedicated to the care of this patient on the date of this encounter to include pre-visit review of  records, ordering of medication, post visit documentation, and face-to-face time with the patient  discussing ADHD symptoms and treatment options. Discussed potential benefits, risks, and side effects of stimulants with patient to include increased heart rate, palpitations, insomnia, increased anxiety, increased irritability, or decreased appetite.  Instructed patient to contact office if experiencing any significant tolerability issues. Discussed potential benefits, risks, and side effects of Vyvanse. Pt agrees to trial of Vyvanse. Will start Vyvanse 40 mg daily for ADHD and binge eating disorder.  Will continue Wellbutrin XL 300 mg daily for depression.  Continue Sertraline 100 mg daily for anxiety and depression.  Continue Hydroxyzine prn insomnia.  Pt reports that she has not taken klonopin prn recently and does not need a refill at this time.  Patient advised to contact office with any questions, adverse effects, or acute worsening in signs and symptoms.    Nyema was seen today for other.  Diagnoses and all orders for this visit:  Attention deficit hyperactivity disorder (ADHD), combined type -     lisdexamfetamine (VYVANSE) 40 MG capsule; Take 1 capsule (40 mg total) by mouth every morning.  Binge eating disorder, unspecified severity -     lisdexamfetamine (VYVANSE) 40 MG capsule; Take 1 capsule (40 mg total) by mouth every morning.     Please see After Visit Summary for patient specific instructions.  No future appointments.  No orders of the defined types were placed in this encounter.     -------------------------------

## 2023-03-15 ENCOUNTER — Telehealth: Payer: Self-pay

## 2023-03-16 NOTE — Telephone Encounter (Signed)
Prior Authorization Lisdexamfetamine 40 mg #30/30 [employer]  Approved Effective:    As long as you remain covered by the [facility] and there are no changes to your plan benefits, this request is approved for the following time period: 03/15/2023 - 03/14/2026

## 2024-02-21 ENCOUNTER — Emergency Department (HOSPITAL_BASED_OUTPATIENT_CLINIC_OR_DEPARTMENT_OTHER)

## 2024-02-21 ENCOUNTER — Encounter (HOSPITAL_BASED_OUTPATIENT_CLINIC_OR_DEPARTMENT_OTHER): Payer: Self-pay | Admitting: Emergency Medicine

## 2024-02-21 ENCOUNTER — Other Ambulatory Visit: Payer: Self-pay

## 2024-02-21 ENCOUNTER — Emergency Department (HOSPITAL_BASED_OUTPATIENT_CLINIC_OR_DEPARTMENT_OTHER): Admission: EM | Admit: 2024-02-21 | Discharge: 2024-02-21 | Disposition: A | Discharge status: Home / Self Care

## 2024-02-21 DIAGNOSIS — R519 Headache, unspecified: Secondary | ICD-10-CM | POA: Insufficient documentation

## 2024-02-21 DIAGNOSIS — H538 Other visual disturbances: Secondary | ICD-10-CM | POA: Insufficient documentation

## 2024-02-21 DIAGNOSIS — R110 Nausea: Secondary | ICD-10-CM | POA: Insufficient documentation

## 2024-02-21 LAB — COMPREHENSIVE METABOLIC PANEL WITH GFR
ALT: 18 U/L (ref 0–44)
AST: 18 U/L (ref 15–41)
Albumin: 4.4 g/dL (ref 3.5–5.0)
Alkaline Phosphatase: 52 U/L (ref 38–126)
Anion gap: 11 (ref 5–15)
BUN: 9 mg/dL (ref 6–20)
CO2: 26 mmol/L (ref 22–32)
Calcium: 10 mg/dL (ref 8.9–10.3)
Chloride: 100 mmol/L (ref 98–111)
Creatinine, Ser: 0.77 mg/dL (ref 0.44–1.00)
GFR, Estimated: >60 mL/min (ref >60)
Glucose, Bld: 91 mg/dL (ref 70–99)
Potassium: 4.2 mmol/L (ref 3.5–5.1)
Sodium: 137 mmol/L (ref 135–145)
Total Bilirubin: 0.4 mg/dL (ref 0.0–1.2)
Total Protein: 7.2 g/dL (ref 6.5–8.1)

## 2024-02-21 LAB — CBC
HCT: 42.7 % (ref 36.0–46.0)
Hemoglobin: 14.2 g/dL (ref 12.0–15.0)
MCH: 28.7 pg (ref 26.0–34.0)
MCHC: 33.3 g/dL (ref 30.0–36.0)
MCV: 86.4 fL (ref 80.0–100.0)
Platelets: 298 K/uL (ref 150–400)
RBC: 4.94 MIL/uL (ref 3.87–5.11)
RDW: 12.4 % (ref 11.5–15.5)
WBC: 10.7 K/uL — ABNORMAL HIGH (ref 4.0–10.5)
nRBC: 0 % (ref 0.0–0.2)

## 2024-02-21 LAB — URINALYSIS, ROUTINE W REFLEX MICROSCOPIC
Bilirubin Urine: NEGATIVE
Glucose, UA: NEGATIVE mg/dL
Hgb urine dipstick: NEGATIVE
Ketones, ur: NEGATIVE mg/dL
Leukocytes,Ua: NEGATIVE
Nitrite: NEGATIVE
Protein, ur: NEGATIVE mg/dL
Specific Gravity, Urine: 1.018 (ref 1.005–1.030)
pH: 6.5 (ref 5.0–8.0)

## 2024-02-21 LAB — PREGNANCY, URINE: Preg Test, Ur: NEGATIVE

## 2024-02-21 MED ORDER — SODIUM CHLORIDE 0.9 % IV BOLUS
1000.0000 mL | Freq: Once | INTRAVENOUS | Status: AC
  Administered 2024-02-21: 1000 mL via INTRAVENOUS

## 2024-02-21 MED ORDER — ONDANSETRON 4 MG PO TBDP
4.0000 mg | ORAL_TABLET | Freq: Once | ORAL | Status: AC | PRN
  Administered 2024-02-21: 4 mg via ORAL
  Filled 2024-02-21: qty 1

## 2024-02-21 MED ORDER — DIPHENHYDRAMINE HCL 50 MG/ML IJ SOLN
12.5000 mg | Freq: Once | INTRAMUSCULAR | Status: AC
  Administered 2024-02-21: 12.5 mg via INTRAVENOUS
  Filled 2024-02-21: qty 1

## 2024-02-21 MED ORDER — METOCLOPRAMIDE HCL 5 MG/ML IJ SOLN
10.0000 mg | Freq: Once | INTRAMUSCULAR | Status: AC
  Administered 2024-02-21: 10 mg via INTRAVENOUS
  Filled 2024-02-21: qty 2

## 2024-02-21 MED ORDER — MAGNESIUM SULFATE 2 GM/50ML IV SOLN
2.0000 g | Freq: Once | INTRAVENOUS | Status: AC
  Administered 2024-02-21: 2 g via INTRAVENOUS
  Filled 2024-02-21: qty 50

## 2024-02-21 MED ORDER — DEXAMETHASONE SOD PHOSPHATE PF 10 MG/ML IJ SOLN
10.0000 mg | Freq: Once | INTRAMUSCULAR | Status: AC
  Administered 2024-02-21: 10 mg via INTRAVENOUS

## 2024-02-21 NOTE — ED Provider Notes (Signed)
  EMERGENCY DEPARTMENT AT [facility] Provider Note   CSN: [id number] Arrival date & time: 02/21/24  1245     Patient presents with: Nausea, Headache, and Blurred Vision   Bridget Rich is a 38 y.o. female.   38 year old female with past medical history of bariatric surgery, PCOS, GERD, depression presents with complaint of a headache and nausea.  Patient states that she was riding roller coasters on Saturday when she developed a headache described as pain behind her right eye.  Patient developed nausea on Sunday.  Today noted she had blurry vision which was brief and has since resolved.  She does not have any difficulty walking, talking, visual disturbance otherwise.  No history of migraines.  States she is never had a headache like this before.       Prior to Admission medications   Medication Sig Start Date End Date Taking? Authorizing Provider  Barberry-Oreg Grape-Goldenseal (BERBERINE COMPLEX PO) Take by mouth.    [provider]  buPROPion  (WELLBUTRIN  XL) 300 MG 24 hr tablet Take 1 tablet (300 mg total) by mouth daily. 02/03/23 05/04/23  Franchot Harlene SQUIBB, PMHNP  Calcium Carbonate (CALCIUM 600 PO) Take by mouth 2 (two) times daily.    [provider]  clonazePAM  (KLONOPIN ) 0.5 MG tablet TAKE 1-2 TABLETS BY MOUTH AT BEDTIME AS NEEDED Patient not taking: Reported on 07/29/2022 03/08/22   Franchot Harlene SQUIBB, PMHNP  cyanocobalamin 1000 MCG tablet Take 1,000 mcg by mouth daily.    [provider]  hydrOXYzine  (VISTARIL ) 25 MG capsule Take 1 capsule (25 mg total) by mouth at bedtime as needed (Insomnia). 02/03/23   Franchot Harlene SQUIBB, PMHNP  Inositol-D Chiro-Inositol 2000-50 MG PACK Take by mouth. Taking for PCOS.    [provider]  lisdexamfetamine (VYVANSE ) 40 MG capsule Take 1 capsule (40 mg total) by mouth every morning. 03/13/23   Franchot Harlene SQUIBB, PMHNP  medroxyPROGESTERone  (PROVERA ) 5 MG tablet Take one tablet po qd x 5 days  every other month if no spontaneous menses. You can increase to every month if needed. Patient not taking: Reported on 02/10/2022 12/09/21   Jertson, Jill Evelyn, MD  Omega-3 Fatty Acids (FISH OIL) 1360 MG CAPS Take by mouth.    [provider]  Prenatal Vit-Fe Fumarate-FA (PRENATAL PO) Take by mouth.    [provider]  sertraline  (ZOLOFT ) 100 MG tablet Take 1 tablet (100 mg total) by mouth daily. 02/03/23 05/04/23  Franchot Harlene SQUIBB, PMHNP  vitamin C (ASCORBIC ACID) 500 MG tablet Take 500 mg by mouth daily.    [provider]    Allergies: Augmentin [amoxicillin-pot clavulanate], Ceclor [cefaclor], and Other    Review of Systems Negative except as per HPI Updated Vital Signs BP 134/81 (BP Location: Right Arm)   Pulse 70   Temp 98.4 F (36.9 C) (Oral)   Resp 16   Ht 5' 8 (1.727 m)   Wt 96.2 kg   LMP 02/08/2024 (Approximate)   SpO2 99%   BMI 32.25 kg/m   Physical Exam Vitals and nursing note reviewed.  Constitutional:      General: She is not in acute distress.    Appearance: She is well-developed. She is not diaphoretic.  HENT:     Head: Normocephalic and atraumatic.     Mouth/Throat:     Mouth: Mucous membranes are moist.  Eyes:     General: No visual field deficit.    Extraocular Movements: Extraocular movements intact.     Pupils:  Pupils are equal, round, and reactive to light.  Pulmonary:     Effort: Pulmonary effort is normal.  Musculoskeletal:     Cervical back: Neck supple.  Skin:    General: Skin is warm and dry.  Neurological:     Mental Status: She is alert and oriented to person, place, and time.     GCS: GCS eye subscore is 4. GCS verbal subscore is 5. GCS motor subscore is 6.     Cranial Nerves: No cranial nerve deficit, dysarthria or facial asymmetry.     Sensory: No sensory deficit.     Motor: No weakness.     Coordination: Coordination normal.  Psychiatric:        Behavior: Behavior normal.     (all labs ordered are  listed, but only abnormal results are displayed) Labs Reviewed  CBC - Abnormal; Notable for the following components:      Result Value   WBC 10.7 (*)    All other components within normal limits  COMPREHENSIVE METABOLIC PANEL WITH GFR  PREGNANCY, URINE  URINALYSIS, ROUTINE W REFLEX MICROSCOPIC    EKG: None  Radiology: No results found.   Procedures   Medications Ordered in the ED  magnesium sulfate IVPB 2 g 50 mL (has no administration in time range)  ondansetron  (ZOFRAN -ODT) disintegrating tablet 4 mg (4 mg Oral Given 02/21/24 1317)  sodium chloride 0.9 % bolus 1,000 mL (1,000 mLs Intravenous New Bag/Given 02/21/24 1612)  dexamethasone (DECADRON) injection 10 mg (10 mg Intravenous Given 02/21/24 1613)  diphenhydrAMINE  (BENADRYL ) injection 12.5 mg (12.5 mg Intravenous Given 02/21/24 1613)  metoCLOPramide  (REGLAN ) injection 10 mg (10 mg Intravenous Given 02/21/24 1615)                                    Medical Decision Making Amount and/or Complexity of Data Reviewed Labs: ordered. Radiology: ordered.  Risk Prescription drug management.   This patient presents to the ED for concern of headache, this involves an extensive number of treatment options, and is a complaint that carries with it a high risk of complications and morbidity.  The differential diagnosis includes but not limited to complex migraine, intracranial injury   Co morbidities / Chronic conditions that complicate the patient evaluation  Gastric sleeve, IUD, anxiety disorder, PCOS   Additional history obtained:  Additional history obtained from EMR External records from outside source obtained and reviewed including prior labs and imaging on file   Lab Tests:  I Ordered, and personally interpreted labs.  The pertinent results include: hCG negative.  CMP normal, CBC without significant findings.  Urinalysis normal.   Imaging Studies ordered:  I ordered imaging studies including CT head I  independently visualized and interpreted imaging which showed no acute process I agree with the radiologist interpretation   Problem List / ED Course / Critical interventions / Medication management  38 year old female presents with complaint of headache and nausea after riding roller coaster 4 days ago.  No history of headaches previously.  Patient did have a prior CT angio earlier this year that was normal.  Head CT today is normal.  Neuroexam is unremarkable.  Provided with headache cocktail with resolution of symptoms.  Suspect migraine.  Return to ER for worsening or concerning symptoms otherwise recheck with PCP this week. I ordered medication including magnesium, IV fluids, Reglan , Decadron, Benadryl , Zofran  Reevaluation of the patient after these medicines showed that the  patient headache is resolved I have reviewed the patients home medicines and have made adjustments as needed   Social Determinants of Health:  Has PCP   Test / Admission - Considered:  Stable for dc      Final diagnoses:  None    ED Discharge Orders     None          Jackson, Fetters, PA-C 02/21/24 1748    Bridget Lavonia SAILOR, MD 02/21/24 1752

## 2024-02-21 NOTE — ED Notes (Signed)

## 2024-02-21 NOTE — Discharge Instructions (Signed)
Home to rest. Follow up with your primary care provider. Return to the ER for worsening or concerning symptoms.

## 2024-02-21 NOTE — ED Triage Notes (Signed)
 Pt via pov from home with headache and nausea since Saturday, worsening today. Pt reports that today she has had some blurred vision when reading. Denies emesis or diarrhea. She also reprts nausea is worse when she moves. Pt a&o x 4; nad noted.
